# Patient Record
Sex: Male | Born: 1975 | Race: Black or African American | Hispanic: No | Marital: Single | Smoking: Never smoker
Health system: Southern US, Community
[De-identification: ages and names within clinical notes are randomized; demographics above are authoritative.]

## PROBLEM LIST (undated history)

## (undated) DIAGNOSIS — J45909 Unspecified asthma, uncomplicated: Secondary | ICD-10-CM

---

## 1997-09-03 ENCOUNTER — Emergency Department (HOSPITAL_COMMUNITY): Admission: EM | Admit: 1997-09-03 | Discharge: 1997-09-03 | Payer: Self-pay | Admitting: Emergency Medicine

## 1998-01-15 ENCOUNTER — Emergency Department (HOSPITAL_COMMUNITY): Admission: EM | Admit: 1998-01-15 | Discharge: 1998-01-15 | Payer: Self-pay | Admitting: Psychiatry

## 1998-11-08 ENCOUNTER — Emergency Department (HOSPITAL_COMMUNITY): Admission: EM | Admit: 1998-11-08 | Discharge: 1998-11-08 | Payer: Self-pay | Admitting: Emergency Medicine

## 2000-06-18 ENCOUNTER — Emergency Department (HOSPITAL_COMMUNITY): Admission: EM | Admit: 2000-06-18 | Discharge: 2000-06-18 | Payer: Self-pay | Admitting: Emergency Medicine

## 2000-06-20 ENCOUNTER — Emergency Department (HOSPITAL_COMMUNITY): Admission: EM | Admit: 2000-06-20 | Discharge: 2000-06-20 | Payer: Self-pay | Admitting: Emergency Medicine

## 2004-04-13 ENCOUNTER — Emergency Department (HOSPITAL_COMMUNITY): Admission: EM | Admit: 2004-04-13 | Discharge: 2004-04-13 | Payer: Self-pay | Admitting: Emergency Medicine

## 2005-02-28 ENCOUNTER — Emergency Department (HOSPITAL_COMMUNITY): Admission: EM | Admit: 2005-02-28 | Discharge: 2005-02-28 | Payer: Self-pay | Admitting: Emergency Medicine

## 2005-06-08 ENCOUNTER — Emergency Department (HOSPITAL_COMMUNITY): Admission: EM | Admit: 2005-06-08 | Discharge: 2005-06-09 | Payer: Self-pay | Admitting: Emergency Medicine

## 2008-07-23 ENCOUNTER — Emergency Department (HOSPITAL_COMMUNITY): Admission: EM | Admit: 2008-07-23 | Discharge: 2008-07-23 | Payer: Self-pay | Admitting: Emergency Medicine

## 2010-05-28 ENCOUNTER — Emergency Department (HOSPITAL_COMMUNITY): Payer: Self-pay

## 2010-05-28 ENCOUNTER — Emergency Department (HOSPITAL_COMMUNITY)
Admission: EM | Admit: 2010-05-28 | Discharge: 2010-05-28 | Disposition: A | Discharge status: Home / Self Care | Payer: Self-pay | Attending: Emergency Medicine | Admitting: Emergency Medicine

## 2010-05-28 DIAGNOSIS — IMO0002 Reserved for concepts with insufficient information to code with codable children: Secondary | ICD-10-CM | POA: Insufficient documentation

## 2010-05-28 DIAGNOSIS — M7989 Other specified soft tissue disorders: Secondary | ICD-10-CM | POA: Insufficient documentation

## 2011-11-06 ENCOUNTER — Encounter (HOSPITAL_COMMUNITY): Payer: Self-pay | Admitting: Emergency Medicine

## 2011-11-06 ENCOUNTER — Emergency Department (HOSPITAL_COMMUNITY)
Admission: EM | Admit: 2011-11-06 | Discharge: 2011-11-06 | Disposition: A | Discharge status: Home / Self Care | Payer: Self-pay | Attending: Emergency Medicine | Admitting: Emergency Medicine

## 2011-11-06 DIAGNOSIS — Y9389 Activity, other specified: Secondary | ICD-10-CM | POA: Insufficient documentation

## 2011-11-06 DIAGNOSIS — W57XXXA Bitten or stung by nonvenomous insect and other nonvenomous arthropods, initial encounter: Secondary | ICD-10-CM

## 2011-11-06 DIAGNOSIS — Y998 Other external cause status: Secondary | ICD-10-CM | POA: Insufficient documentation

## 2011-11-06 DIAGNOSIS — IMO0001 Reserved for inherently not codable concepts without codable children: Secondary | ICD-10-CM | POA: Insufficient documentation

## 2011-11-06 MED ORDER — SULFAMETHOXAZOLE-TRIMETHOPRIM 800-160 MG PO TABS: 1.0000 | ORAL_TABLET | Freq: Two times a day (BID) | ORAL | Status: DC

## 2011-11-06 MED ORDER — DIPHENHYDRAMINE HCL 25 MG PO TABS: 25.0000 mg | ORAL_TABLET | Freq: Four times a day (QID) | ORAL | Status: DC | PRN

## 2011-11-06 MED ORDER — SULFAMETHOXAZOLE-TRIMETHOPRIM 800-160 MG PO TABS: 1.0000 | ORAL_TABLET | Freq: Two times a day (BID) | ORAL | Status: AC

## 2011-11-06 MED ORDER — OXYCODONE-ACETAMINOPHEN 5-325 MG PO TABS: 2.0000 | ORAL_TABLET | ORAL | Status: DC | PRN

## 2011-11-06 MED ORDER — IBUPROFEN 800 MG PO TABS: 800.0000 mg | ORAL_TABLET | Freq: Four times a day (QID) | ORAL | Status: DC | PRN

## 2011-11-06 MED ORDER — IBUPROFEN 800 MG PO TABS
800.0000 mg | ORAL_TABLET | Freq: Once | ORAL | Status: AC
  Administered 2011-11-06: 800 mg via ORAL
  Filled 2011-11-06: qty 1

## 2011-11-06 MED ORDER — HYDROXYZINE HCL 10 MG PO TABS
10.0000 mg | ORAL_TABLET | Freq: Once | ORAL | Status: AC
  Administered 2011-11-06: 10 mg via ORAL
  Filled 2011-11-06: qty 1

## 2011-11-06 NOTE — ED Notes (Signed)
Pt reports ?insect bite to right forearm, noticed several days ago, this am began to become reddened and swollen and painful.

## 2011-11-06 NOTE — ED Provider Notes (Signed)
History     CSN: [id number]  Arrival date & time 11/06/11  0900   None     Chief Complaint  Patient presents with  . Insect Bite    (Consider location/radiation/quality/duration/timing/severity/associated sxs/prior treatment) HPI Comments: Patient presents with a 3 day history of insect bite on his right arm. 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 Today he states that pain that is throbbing, 8/9-10 at the site of the bite, without radiation or transmission. Associated symptoms include numbness around the bite, swelling, and pruritis. Denies fever or chills. Denies NVD or abdominal pain. Denies SOB, wheezing, or difficulty swallowing.  The history is provided by the patient.    History reviewed. No pertinent past medical history.  History reviewed. No pertinent past surgical history.  History reviewed. No pertinent family history.  History  Substance Use Topics  . Smoking status: Not on file  . Smokeless tobacco: Not on file  . Alcohol Use: Not on file      Review of Systems  Constitutional: Negative for fever and chills.  HENT: Negative for trouble swallowing.   Respiratory: Negative for shortness of breath and wheezing.   Gastrointestinal: Negative for nausea, vomiting, abdominal pain and diarrhea.  Skin: Positive for color change.    Allergies  Penicillins  Home Medications   Current Outpatient Rx  Name Route Sig Dispense Refill  . ALBUTEROL SULFATE HFA 108 (90 BASE) MCG/ACT IN AERS Inhalation Inhale 2 puffs into the lungs every 6 (six) hours as needed. For shortness of breath.      BP 126/67  Pulse 80  Temp 98.2 F (36.8 C) (Oral)  Resp 16  SpO2 98%  Physical Exam  Nursing note and vitals reviewed. Constitutional: He appears well-developed and well-nourished. No distress.  HENT:  Head: Normocephalic and atraumatic.  Mouth/Throat: Oropharynx is clear and moist. No oropharyngeal exudate.  Eyes: Conjunctivae normal and EOM are normal. No scleral  icterus.  Neck: Normal range of motion. Neck supple.  Cardiovascular: Normal rate, regular rhythm and normal heart sounds.   Pulmonary/Chest: Effort normal and breath sounds normal. He has no wheezes.  Abdominal: Soft. Bowel sounds are normal. There is no tenderness.  Neurological: He is alert.  Skin: Skin is warm.       ED Course  Procedures (including critical care time)  Labs Reviewed - No data to display No results found.   1. Insect bite       MDM  Patient presented with complaint of insect bite, itching, pain and swelling. Patient given ibuprofen and hydroxyzine with resolution of pain and itching. Patient discharged on a short course of bactrim, ibuprofen for pain, and benadryl for itching. Return precautions given verbally and in discharge summary. Patient has no red flags for abscess or system anaphylaxis.     Pixie Casino, PA-C 11/06/11 [phone]

## 2011-11-07 NOTE — ED Provider Notes (Signed)
Medical screening examination/treatment/procedure(s) were performed by non-physician practitioner and as supervising physician I was immediately available for consultation/collaboration.  Kamal Jurgens R. Carter Kassel, MD 11/07/11 0657 

## 2014-06-29 ENCOUNTER — Encounter (HOSPITAL_COMMUNITY): Payer: Self-pay | Admitting: Emergency Medicine

## 2014-06-29 ENCOUNTER — Emergency Department (HOSPITAL_COMMUNITY)
Admission: EM | Admit: 2014-06-29 | Discharge: 2014-06-29 | Disposition: A | Discharge status: Home / Self Care | Payer: Self-pay | Attending: Emergency Medicine | Admitting: Emergency Medicine

## 2014-06-29 DIAGNOSIS — Z202 Contact with and (suspected) exposure to infections with a predominantly sexual mode of transmission: Secondary | ICD-10-CM | POA: Insufficient documentation

## 2014-06-29 DIAGNOSIS — Z880 Allergy status to penicillin: Secondary | ICD-10-CM | POA: Insufficient documentation

## 2014-06-29 DIAGNOSIS — R21 Rash and other nonspecific skin eruption: Secondary | ICD-10-CM | POA: Insufficient documentation

## 2014-06-29 DIAGNOSIS — J069 Acute upper respiratory infection, unspecified: Secondary | ICD-10-CM | POA: Insufficient documentation

## 2014-06-29 DIAGNOSIS — Z79899 Other long term (current) drug therapy: Secondary | ICD-10-CM | POA: Insufficient documentation

## 2014-06-29 LAB — GC/CHLAMYDIA PROBE AMP (~~LOC~~) NOT AT ARMC
Chlamydia: NEGATIVE
NEISSERIA GONORRHEA: NEGATIVE

## 2014-06-29 LAB — RPR: RPR: NONREACTIVE

## 2014-06-29 MED ORDER — DOXYCYCLINE HYCLATE 100 MG PO CAPS: 100.0000 mg | ORAL_CAPSULE | Freq: Two times a day (BID) | ORAL | Status: DC

## 2014-06-29 NOTE — ED Notes (Addendum)
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 States last week he had a "rash"; no rash present today. Pt in NAD. VSS. A/O x4.

## 2014-06-29 NOTE — Discharge Instructions (Signed)
Upper Respiratory Infection, Adult °An upper respiratory infection (URI) is also sometimes known as the common cold. The upper respiratory tract includes the nose, sinuses, throat, trachea, and bronchi. Bronchi are the airways leading to the lungs. Most people improve within 1 week, but symptoms can last up to 2 weeks. A residual cough may last even longer.  °CAUSES °Many different viruses can infect the tissues lining the upper respiratory tract. The tissues become irritated and inflamed and often become very moist. Mucus production is also common. A cold is contagious. You can easily spread the virus to others by oral contact. This includes kissing, sharing a glass, coughing, or sneezing. Touching your mouth or nose and then touching a surface, which is then touched by another person, can also spread the virus. °SYMPTOMS  °Symptoms typically develop 1 to 3 days after you come in contact with a cold virus. Symptoms vary from person to person. They may include: °· Runny nose. °· Sneezing. °· Nasal congestion. °· Sinus irritation. °· Sore throat. °· Loss of voice (laryngitis). °· Cough. °· Fatigue. °· Muscle aches. °· Loss of appetite. °· Headache. °· Low-grade fever. °DIAGNOSIS  °You might diagnose your own cold based on familiar symptoms, since most people get a cold 2 to 3 times a year. Your caregiver can confirm this based on your exam. Most importantly, your caregiver can check that your symptoms are not due to another disease such as strep throat, sinusitis, pneumonia, asthma, or epiglottitis. Blood tests, throat tests, and X-rays are not necessary to diagnose a common cold, but they may sometimes be helpful in excluding other more serious diseases. Your caregiver will decide if any further tests are required. °RISKS AND COMPLICATIONS  °You may be at risk for a more severe case of the common cold if you smoke cigarettes, have chronic heart disease (such as heart failure) or lung disease (such as asthma), or if  you have a weakened immune system. The very young and very old are also at risk for more serious infections. Bacterial sinusitis, middle ear infections, and bacterial pneumonia can complicate the common cold. The common cold can worsen asthma and chronic obstructive pulmonary disease (COPD). Sometimes, these complications can require emergency medical care and may be life-threatening. °PREVENTION  °The best way to protect against getting a cold is to practice good hygiene. Avoid oral or hand contact with people with cold symptoms. Wash your hands often if contact occurs. There is no clear evidence that vitamin C, vitamin E, echinacea, or exercise reduces the chance of developing a cold. However, it is always recommended to get plenty of rest and practice good nutrition. °TREATMENT  °Treatment is directed at relieving symptoms. There is no cure. Antibiotics are not effective, because the infection is caused by a virus, not by bacteria. Treatment may include: °· Increased fluid intake. Sports drinks offer valuable electrolytes, sugars, and fluids. °· Breathing heated mist or steam (vaporizer or shower). °· Eating chicken soup or other clear broths, and maintaining good nutrition. °· Getting plenty of rest. °· Using gargles or lozenges for comfort. °· Controlling fevers with ibuprofen or acetaminophen as directed by your caregiver. °· Increasing usage of your inhaler if you have asthma. °Zinc gel and zinc lozenges, taken in the first 24 hours of the common cold, can shorten the duration and lessen the severity of symptoms. Pain medicines may help with fever, muscle aches, and throat pain. A variety of non-prescription medicines are available to treat congestion and runny nose. Your caregiver   can make recommendations and may suggest nasal or lung inhalers for other symptoms.  HOME CARE INSTRUCTIONS   Only take over-the-counter or prescription medicines for pain, discomfort, or fever as directed by your  caregiver.  Use a warm mist humidifier or inhale steam from a shower to increase air moisture. This may keep secretions moist and make it easier to breathe.  Drink enough water and fluids to keep your urine clear or pale yellow.  Rest as needed.  Return to work when your temperature has returned to normal or as your caregiver advises. You may need to stay home longer to avoid infecting others. You can also use a face mask and careful hand washing to prevent spread of the virus. SEEK MEDICAL CARE IF:   After the first few days, you feel you are getting worse rather than better.  You need your caregiver's advice about medicines to control symptoms.  You develop chills, worsening shortness of breath, or brown or red sputum. These may be signs of pneumonia.  You develop yellow or brown nasal discharge or pain in the face, especially when you bend forward. These may be signs of sinusitis.  You develop a fever, swollen neck glands, pain with swallowing, or white areas in the back of your throat. These may be signs of strep throat. SEEK IMMEDIATE MEDICAL CARE IF:   You have a fever.  You develop severe or persistent headache, ear pain, sinus pain, or chest pain.  You develop wheezing, a prolonged cough, cough up blood, or have a change in your usual mucus (if you have chronic lung disease).  You develop sore muscles or a stiff neck. Document Released: 07/15/2000 Document Revised: 04/13/2011 Document Reviewed: 04/26/2013 [facility] Patient Information 2015 [place], [place]. This information is not intended to replace advice given to you by your health care provider. Make sure you discuss any questions you have with your health care provider. Safe Sex Safe sex is about reducing the risk of giving or getting a sexually transmitted disease (STD). STDs are spread through sexual contact involving the genitals, mouth, or rectum. Some STDs can be cured and others cannot. Safe sex can also prevent  unintended pregnancies.  WHAT ARE SOME SAFE SEX PRACTICES?  Limit your sexual activity to only one partner who is having sex with only you.  Talk to your partner about his or her past partners, past STDs, and drug use.  Use a condom every time you have sexual intercourse. This includes vaginal, oral, and anal sexual activity. Both females and males should wear condoms during oral sex. Only use latex or polyurethane condoms and water-based lubricants. Using petroleum-based lubricants or oils to lubricate a condom will weaken the condom and increase the chance that it will break. The condom should be in place from the beginning to the end of sexual activity. Wearing a condom reduces, but does not completely eliminate, your risk of getting or giving an STD. STDs can be spread by contact with infected body fluids and skin.  Get vaccinated for hepatitis B and HPV.  Avoid alcohol and recreational drugs, which can affect your judgment. You may forget to use a condom or participate in high-risk sex.  For females, avoid douching after sexual intercourse. Douching can spread an infection farther into the reproductive tract.  Check your body for signs of sores, blisters, rashes, or unusual discharge. See your health care provider if you notice any of these signs.  Avoid sexual contact if you have symptoms of an infection  or are being treated for an STD. If you or your partner has herpes, avoid sexual contact when blisters are present. Use condoms at all other times.  If you are at risk of being infected with HIV, it is recommended that you take a prescription medicine daily to prevent HIV infection. This is called pre-exposure prophylaxis (PrEP). You are considered at risk if:  You are a man who has sex with other men (MSM).  You are a heterosexual man or woman who is sexually active with more than one partner.  You take drugs by injection.  You are sexually active with a partner who has HIV.  Talk  with your health care provider about whether you are at high risk of being infected with HIV. If you choose to begin PrEP, you should first be tested for HIV. You should then be tested every 3 months for as long as you are taking PrEP.  See your health care provider for regular screenings, exams, and tests for other STDs. Before having sex with a new partner, each of you should be screened for STDs and should talk about the results with each other. WHAT ARE THE BENEFITS OF SAFE SEX?   There is less chance of getting or giving an STD.  You can prevent unwanted or unintended pregnancies.  By discussing safe sex concerns with your partner, you may increase feelings of intimacy, comfort, trust, and honesty between the two of you. Document Released: 02/27/2004 Document Revised: 06/05/2013 Document Reviewed: 07/13/2011 [facility] Patient Information 2015 [place]. This information is not intended to replace advice given to you by your health care provider. Make sure you discuss any questions you have with your health care provider.

## 2014-06-29 NOTE — ED Provider Notes (Signed)
CSN: [id number]     Arrival date & time 06/29/14  4098 History   First MD Initiated Contact with Patient 06/29/14 [phone]     Chief Complaint  Patient presents with  . URI     (Consider location/radiation/quality/duration/timing/severity/associated sxs/prior Treatment) Patient is a 39 y.o. male presenting with URI. The history is provided by the patient. No language interpreter was used.  URI Presenting symptoms: congestion   Severity:  Moderate Onset quality:  Gradual Duration:  1 week Timing:  Constant Progression:  Worsening Chronicity:  New Relieved by:  Nothing Worsened by:  Nothing tried Associated symptoms: no arthralgias   Risk factors: no recent illness and no sick contacts   Pt reports he had a rash on his hand last week.  Pt thinks he has syphilis.  Pt request treatment. Pt reports he has a cough as well  History reviewed. No pertinent past medical history. History reviewed. No pertinent past surgical history. History reviewed. No pertinent family history. History  Substance Use Topics  . Smoking status: Never Smoker   . Smokeless tobacco: Not on file  . Alcohol Use: Yes     Comment: 3-4 times a week, socially.     Review of Systems  HENT: Positive for congestion.   Musculoskeletal: Negative for arthralgias.  All other systems reviewed and are negative.     Allergies  Penicillins  Home Medications   Prior to Admission medications   Medication Sig Start Date End Date Taking? Authorizing Provider  Chlorphen-Phenyleph-ASA (ALKA-SELTZER PLUS COLD PO) Take 1 tablet by mouth every 6 (six) hours as needed (cough/cold symptoms).   Yes Historical Provider, MD  pseudoephedrine-acetaminophen (TYLENOL SINUS) 30-500 MG TABS Take 1 tablet by mouth every 4 (four) hours as needed (sinus allergies).   Yes Historical Provider, MD  albuterol (PROVENTIL HFA;VENTOLIN HFA) 108 (90 BASE) MCG/ACT inhaler Inhale 2 puffs into the lungs every 6 (six) hours as needed. For shortness  of breath.    Historical Provider, MD  diphenhydrAMINE (BENADRYL) 25 MG tablet Take 1 tablet (25 mg total) by mouth every 6 (six) hours as needed for itching (Rash). Patient not taking: Reported on 06/29/2014 11/06/11   Tia L Oliveri, PA-C  ibuprofen (ADVIL,MOTRIN) 800 MG tablet Take 1 tablet (800 mg total) by mouth every 6 (six) hours as needed for pain. 11/06/11   Tia L Oliveri, PA-C   BP 138/81 mmHg  Pulse 80  Temp(Src) 98.2 F (36.8 C) (Oral)  Resp 12  Ht  (1.676 m)  Wt 230 lb (104.327 kg)  BMI 37.14 kg/m2  SpO2 97% Physical Exam  Constitutional: He appears well-developed and well-nourished.  HENT:  Head: Normocephalic.  Eyes: Conjunctivae and EOM are normal. Pupils are equal, round, and reactive to light.  Cardiovascular: Normal rate and normal heart sounds.   Pulmonary/Chest: Effort normal and breath sounds normal.  Abdominal: Soft.  Musculoskeletal: Normal range of motion.  Neurological: He is alert.  Skin: Skin is warm.  Psychiatric: He has a normal mood and affect.  Nursing note and vitals reviewed.   ED Course  Procedures (including critical care time) Labs Review Labs Reviewed  RPR  HIV ANTIBODY (ROUTINE TESTING)  GC/CHLAMYDIA PROBE AMP ([place]) NOT AT [facility]    Imaging Review No results found.   EKG Interpretation None      MDM   Final diagnoses:  Rash  URI (upper respiratory infection)  STD exposure    Doxycycline x 14 days Pt advised to follow up at health dept  Lonia SkinnerLeslie K BuffaloSofia, PA-C 06/29/14 [phone]  Blake DivineJohn Wofford, MD 07/03/14 [phone]

## 2014-07-01 LAB — HIV ANTIBODY (ROUTINE TESTING W REFLEX): HIV SCREEN 4TH GENERATION: NONREACTIVE

## 2015-05-31 ENCOUNTER — Encounter (HOSPITAL_COMMUNITY): Payer: Self-pay | Admitting: Emergency Medicine

## 2015-05-31 DIAGNOSIS — Z880 Allergy status to penicillin: Secondary | ICD-10-CM | POA: Insufficient documentation

## 2015-05-31 DIAGNOSIS — Y998 Other external cause status: Secondary | ICD-10-CM | POA: Insufficient documentation

## 2015-05-31 DIAGNOSIS — Z79899 Other long term (current) drug therapy: Secondary | ICD-10-CM | POA: Insufficient documentation

## 2015-05-31 DIAGNOSIS — J45909 Unspecified asthma, uncomplicated: Secondary | ICD-10-CM | POA: Insufficient documentation

## 2015-05-31 DIAGNOSIS — Y9389 Activity, other specified: Secondary | ICD-10-CM | POA: Insufficient documentation

## 2015-05-31 DIAGNOSIS — Z23 Encounter for immunization: Secondary | ICD-10-CM | POA: Insufficient documentation

## 2015-05-31 DIAGNOSIS — S0181XA Laceration without foreign body of other part of head, initial encounter: Secondary | ICD-10-CM | POA: Insufficient documentation

## 2015-05-31 DIAGNOSIS — Y92009 Unspecified place in unspecified non-institutional (private) residence as the place of occurrence of the external cause: Secondary | ICD-10-CM | POA: Insufficient documentation

## 2015-05-31 DIAGNOSIS — S0990XA Unspecified injury of head, initial encounter: Secondary | ICD-10-CM | POA: Insufficient documentation
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
## 2015-06-01 ENCOUNTER — Emergency Department (HOSPITAL_COMMUNITY): Payer: Self-pay

## 2015-06-01 ENCOUNTER — Emergency Department (HOSPITAL_COMMUNITY)
Admission: EM | Admit: 2015-06-01 | Discharge: 2015-06-01 | Disposition: A | Discharge status: Home / Self Care | Payer: Self-pay | Attending: Emergency Medicine | Admitting: Emergency Medicine

## 2015-06-01 DIAGNOSIS — S0181XA Laceration without foreign body of other part of head, initial encounter: Secondary | ICD-10-CM

## 2015-06-01 DIAGNOSIS — S0990XA Unspecified injury of head, initial encounter: Secondary | ICD-10-CM

## 2015-06-01 HISTORY — DX: Unspecified asthma, uncomplicated: J45.909

## 2015-06-01 MED ORDER — OXYCODONE-ACETAMINOPHEN 5-325 MG PO TABS
2.0000 | ORAL_TABLET | Freq: Once | ORAL | Status: AC
  Administered 2015-06-01: 2 via ORAL
  Filled 2015-06-01: qty 2

## 2015-06-01 MED ORDER — TETANUS-DIPHTH-ACELL PERTUSSIS 5-2.5-18.5 LF-MCG/0.5 IM SUSP
0.5000 mL | Freq: Once | INTRAMUSCULAR | Status: AC
  Administered 2015-06-01: 0.5 mL via INTRAMUSCULAR
  Filled 2015-06-01: qty 0.5

## 2015-06-01 MED ORDER — TRAMADOL HCL 50 MG PO TABS: 50.0000 mg | ORAL_TABLET | Freq: Four times a day (QID) | ORAL | Status: AC | PRN

## 2015-06-01 MED ORDER — LIDOCAINE-EPINEPHRINE (PF) 2 %-1:200000 IJ SOLN
10.0000 mL | Freq: Once | INTRAMUSCULAR | Status: AC
  Administered 2015-06-01: 10 mL
  Filled 2015-06-01: qty 20
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
## 2015-06-01 NOTE — Discharge Instructions (Signed)
Facial Laceration ° A facial laceration is a cut on the face. These injuries can be painful and cause bleeding. Lacerations usually heal quickly, but they need special care to reduce scarring. °DIAGNOSIS  °Your health care provider will take a medical history, ask for details about how the injury occurred, and examine the wound to determine how deep the cut is. °TREATMENT  °Some facial lacerations may not require closure. Others may not be able to be closed because of an increased risk of infection. The risk of infection and the chance for successful closure will depend on various factors, including the amount of time since the injury occurred. °The wound may be cleaned to help prevent infection. If closure is appropriate, pain medicines may be given if needed. Your health care provider will use stitches (sutures), wound glue (adhesive), or skin adhesive strips to repair the laceration. These tools bring the skin edges together to allow for faster healing and a better cosmetic outcome. If needed, you may also be given a tetanus shot. °HOME CARE INSTRUCTIONS °· Only take over-the-counter or prescription medicines as directed by your health care provider. °· Follow your health care provider's instructions for wound care. These instructions will vary depending on the technique used for closing the wound. °For Sutures: °· Keep the wound clean and dry.   °· If you were given a bandage (dressing), you should change it at least once a day. Also change the dressing if it becomes wet or dirty, or as directed by your health care provider.   °· Wash the wound with soap and water 2 times a day. Rinse the wound off with water to remove all soap. Pat the wound dry with a clean towel.   °· After cleaning, apply a thin layer of the antibiotic ointment recommended by your health care provider. This will help prevent infection and keep the dressing from sticking.   °· You may shower as usual after the first 24 hours. Do not soak the  wound in water until the sutures are removed.   °· Get your sutures removed as directed by your health care provider. With facial lacerations, sutures should usually be taken out after 4-5 days to avoid stitch marks.   °· Wait a few days after your sutures are removed before applying any makeup. °For Skin Adhesive Strips: °· Keep the wound clean and dry.   °· Do not get the skin adhesive strips wet. You may bathe carefully, using caution to keep the wound dry.   °· If the wound gets wet, pat it dry with a clean towel.   °· Skin adhesive strips will fall off on their own. You may trim the strips as the wound heals. Do not remove skin adhesive strips that are still stuck to the wound. They will fall off in time.   °For Wound Adhesive: °· You may briefly wet your wound in the shower or bath. Do not soak or scrub the wound. Do not swim. Avoid periods of heavy sweating until the skin adhesive has fallen off on its own. After showering or bathing, gently pat the wound dry with a clean towel.   °· Do not apply liquid medicine, cream medicine, ointment medicine, or makeup to your wound while the skin adhesive is in place. This may loosen the film before your wound is healed.   °· If a dressing is placed over the wound, be careful not to apply tape directly over the skin adhesive. This may cause the adhesive to be pulled off before the wound is healed.   °· Avoid   prolonged exposure to sunlight or tanning lamps while the skin adhesive is in place. °· The skin adhesive will usually remain in place for 5-10 days, then naturally fall off the skin. Do not pick at the adhesive film.   °After Healing: °Once the wound has healed, cover the wound with sunscreen during the day for 1 full year. This can help minimize scarring. Exposure to ultraviolet light in the first year will darken the scar. It can take 1-2 years for the scar to lose its redness and to heal completely.  °SEEK MEDICAL CARE IF: °· You have a fever. °SEEK IMMEDIATE  MEDICAL CARE IF: °· You have redness, pain, or swelling around the wound.   °· You see a yellowish-white fluid (pus) coming from the wound.   °  °This information is not intended to replace advice given to you by your health care provider. Make sure you discuss any questions you have with your health care provider. °  °Document Released: 02/27/2004 Document Revised: 02/09/2014 Document Reviewed: 09/01/2012 °Elsevier Interactive Patient Education ©2016 [employer] ° °

## 2015-06-22 ENCOUNTER — Emergency Department (HOSPITAL_COMMUNITY)
Admission: EM | Admit: 2015-06-22 | Discharge: 2015-06-22 | Disposition: A | Discharge status: Home / Self Care | Payer: Self-pay | Attending: Emergency Medicine | Admitting: Emergency Medicine

## 2015-06-22 DIAGNOSIS — J45909 Unspecified asthma, uncomplicated: Secondary | ICD-10-CM | POA: Insufficient documentation

## 2015-06-22 DIAGNOSIS — Z4802 Encounter for removal of sutures: Secondary | ICD-10-CM | POA: Insufficient documentation
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
## 2015-06-22 NOTE — Discharge Instructions (Signed)
May wish to use cocoa butter or mederma to help reduce scarring.  Suture Removal, Care After Refer to this sheet in the next few weeks. These instructions provide you with information on caring for yourself after your procedure. Your health care provider may also give you more specific instructions. Your treatment has been planned according to current medical practices, but problems sometimes occur. Call your health care provider if you have any problems or questions after your procedure. WHAT TO EXPECT AFTER THE PROCEDURE After your stitches (sutures) are removed, it is typical to have the following:  Some discomfort and swelling in the wound area.  Slight redness in the area. HOME CARE INSTRUCTIONS   If you have skin adhesive strips over the wound area, do not take the strips off. They will fall off on their own in a few days. If the strips remain in place after 14 days, you may remove them.  Change any bandages (dressings) at least once a day or as directed by your health care provider. If the bandage sticks, soak it off with warm, soapy water.  Apply cream or ointment only as directed by your health care provider. If using cream or ointment, wash the area with soap and water 2 times a day to remove all the cream or ointment. Rinse off the soap and pat the area dry with a clean towel.  Keep the wound area dry and clean. If the bandage becomes wet or dirty, or if it develops a bad smell, change it as soon as possible.  Continue to protect the wound from injury.  Use sunscreen when out in the sun. New scars become sunburned easily. SEEK MEDICAL CARE IF:  You have increasing redness, swelling, or pain in the wound.  You see pus coming from the wound.  You have a fever.  You notice a bad smell coming from the wound or dressing.  Your wound breaks open (edges not staying together).   This information is not intended to replace advice given to you by your health care provider. Make  sure you discuss any questions you have with your health care provider.   Document Released: 10/14/2000 Document Revised: 11/09/2012 Document Reviewed: 08/31/2012 Elsevier Interactive Patient Education [employer]2016 Elsevier Inc[employer]

## 2015-06-22 NOTE — ED Notes (Signed)
Declined W/C at D/C and was escorted to lobby by RN. 

## 2015-06-22 NOTE — ED Notes (Signed)
Pt presents for removal of sutures

## 2017-06-21 ENCOUNTER — Observation Stay (HOSPITAL_COMMUNITY)
Admission: EM | Admit: 2017-06-21 | Discharge: 2017-06-22 | Disposition: A | Discharge status: Home / Self Care | Payer: Self-pay | Attending: Urology | Admitting: Urology

## 2017-06-21 ENCOUNTER — Emergency Department (HOSPITAL_COMMUNITY): Payer: Self-pay | Admitting: Anesthesiology

## 2017-06-21 ENCOUNTER — Emergency Department (HOSPITAL_COMMUNITY): Payer: Self-pay

## 2017-06-21 ENCOUNTER — Encounter (HOSPITAL_COMMUNITY): Payer: Self-pay

## 2017-06-21 ENCOUNTER — Encounter (HOSPITAL_COMMUNITY)
Admission: EM | Disposition: A | Discharge status: Home / Self Care | Payer: Self-pay | Source: Home / Self Care | Attending: Emergency Medicine

## 2017-06-21 DIAGNOSIS — Y9389 Activity, other specified: Secondary | ICD-10-CM | POA: Insufficient documentation

## 2017-06-21 DIAGNOSIS — S3994XA Unspecified injury of external genitals, initial encounter: Secondary | ICD-10-CM | POA: Diagnosis present

## 2017-06-21 DIAGNOSIS — W378XXA Explosion and rupture of other pressurized tire, pipe or hose, initial encounter: Secondary | ICD-10-CM | POA: Insufficient documentation

## 2017-06-21 DIAGNOSIS — Y990 Civilian activity done for income or pay: Secondary | ICD-10-CM | POA: Insufficient documentation

## 2017-06-21 DIAGNOSIS — Z880 Allergy status to penicillin: Secondary | ICD-10-CM | POA: Insufficient documentation

## 2017-06-21 DIAGNOSIS — J45909 Unspecified asthma, uncomplicated: Secondary | ICD-10-CM | POA: Insufficient documentation

## 2017-06-21 DIAGNOSIS — Y9289 Other specified places as the place of occurrence of the external cause: Secondary | ICD-10-CM | POA: Insufficient documentation

## 2017-06-21 DIAGNOSIS — Z7982 Long term (current) use of aspirin: Secondary | ICD-10-CM | POA: Insufficient documentation

## 2017-06-21 DIAGNOSIS — S39848A Other specified injuries of external genitals, initial encounter: Principal | ICD-10-CM | POA: Insufficient documentation

## 2017-06-21 DIAGNOSIS — S3130XA Unspecified open wound of scrotum and testes, initial encounter: Secondary | ICD-10-CM

## 2017-06-21 DIAGNOSIS — Z79899 Other long term (current) drug therapy: Secondary | ICD-10-CM | POA: Insufficient documentation

## 2017-06-21 DIAGNOSIS — N433 Hydrocele, unspecified: Secondary | ICD-10-CM | POA: Insufficient documentation

## 2017-06-21 HISTORY — PX: SCROTAL EXPLORATION: SHX2386

## 2017-06-21 LAB — I-STAT CHEM 8, ED
BUN: 11 mg/dL (ref 6–20)
CHLORIDE: 103 mmol/L (ref 101–111)
CREATININE: 1.1 mg/dL (ref 0.61–1.24)
Calcium, Ion: 1.13 mmol/L — ABNORMAL LOW (ref 1.15–1.40)
Glucose, Bld: 109 mg/dL — ABNORMAL HIGH (ref 65–99)
HEMATOCRIT: 46 % (ref 39.0–52.0)
HEMOGLOBIN: 15.6 g/dL (ref 13.0–17.0)
POTASSIUM: 3.7 mmol/L (ref 3.5–5.1)
Sodium: 142 mmol/L (ref 135–145)
TCO2: 26 mmol/L (ref 22–32)

## 2017-06-21 LAB — CBC WITH DIFFERENTIAL/PLATELET
Abs Immature Granulocytes: 0 10*3/uL (ref 0.0–0.1)
BASOS ABS: 0 10*3/uL (ref 0.0–0.1)
BASOS PCT: 0 %
EOS ABS: 0.2 10*3/uL (ref 0.0–0.7)
EOS PCT: 2 %
HCT: 46.2 % (ref 39.0–52.0)
Hemoglobin: 15.3 g/dL (ref 13.0–17.0)
Immature Granulocytes: 0 %
Lymphocytes Relative: 19 %
Lymphs Abs: 1.7 10*3/uL (ref 0.7–4.0)
MCH: 30.1 pg (ref 26.0–34.0)
MCHC: 33.1 g/dL (ref 30.0–36.0)
MCV: 90.8 fL (ref 78.0–100.0)
MONOS PCT: 8 %
Monocytes Absolute: 0.8 10*3/uL (ref 0.1–1.0)
NEUTROS ABS: 6.3 10*3/uL (ref 1.7–7.7)
NEUTROS PCT: 71 %
PLATELETS: 187 10*3/uL (ref 150–400)
RBC: 5.09 MIL/uL (ref 4.22–5.81)
RDW: 13.3 % (ref 11.5–15.5)
WBC: 9 10*3/uL (ref 4.0–10.5)

## 2017-06-21 LAB — URINALYSIS, ROUTINE W REFLEX MICROSCOPIC
Bilirubin Urine: NEGATIVE
GLUCOSE, UA: NEGATIVE mg/dL
Ketones, ur: NEGATIVE mg/dL
Nitrite: NEGATIVE
PH: 5 (ref 5.0–8.0)
PROTEIN: NEGATIVE mg/dL
Specific Gravity, Urine: 1.023 (ref 1.005–1.030)

## 2017-06-21 SURGERY — EXPLORATION, SCROTUM
Anesthesia: General | Site: Scrotum | Laterality: Left

## 2017-06-21 MED ORDER — 0.9 % SODIUM CHLORIDE (POUR BTL) OPTIME
TOPICAL | Status: DC | PRN
  Administered 2017-06-21: 1000 mL

## 2017-06-21 MED ORDER — ONDANSETRON HCL 4 MG/2ML IJ SOLN
4.0000 mg | Freq: Once | INTRAMUSCULAR | Status: AC
  Administered 2017-06-21: 4 mg via INTRAVENOUS
  Filled 2017-06-21: qty 2

## 2017-06-21 MED ORDER — MORPHINE SULFATE (PF) 4 MG/ML IV SOLN
4.0000 mg | Freq: Once | INTRAVENOUS | Status: AC
  Administered 2017-06-21: 4 mg via INTRAVENOUS
  Filled 2017-06-21: qty 1

## 2017-06-21 MED ORDER — OXYCODONE-ACETAMINOPHEN 5-325 MG PO TABS: 1.0000 | ORAL_TABLET | ORAL | 0 refills | Status: DC | PRN

## 2017-06-21 MED ORDER — DIPHENHYDRAMINE HCL 12.5 MG/5ML PO ELIX: 12.5000 mg | ORAL_SOLUTION | Freq: Four times a day (QID) | ORAL | Status: DC | PRN

## 2017-06-21 MED ORDER — OXYCODONE HCL 5 MG PO TABS: 5.0000 mg | ORAL_TABLET | ORAL | Status: DC | PRN

## 2017-06-21 MED ORDER — MIDAZOLAM HCL 2 MG/2ML IJ SOLN
INTRAMUSCULAR | Status: AC
  Filled 2017-06-21: qty 2

## 2017-06-21 MED ORDER — OXYCODONE-ACETAMINOPHEN 5-325 MG PO TABS
1.0000 | ORAL_TABLET | Freq: Once | ORAL | Status: AC
  Administered 2017-06-21: 1 via ORAL
  Filled 2017-06-21: qty 1

## 2017-06-21 MED ORDER — FENTANYL CITRATE (PF) 100 MCG/2ML IJ SOLN
INTRAMUSCULAR | Status: AC
  Filled 2017-06-21: qty 2

## 2017-06-21 MED ORDER — SUCCINYLCHOLINE CHLORIDE 20 MG/ML IJ SOLN
INTRAMUSCULAR | Status: DC | PRN
  Administered 2017-06-21: 140 mg via INTRAVENOUS

## 2017-06-21 MED ORDER — HYDROMORPHONE HCL 2 MG/ML IJ SOLN: 0.5000 mg | INTRAMUSCULAR | Status: DC | PRN

## 2017-06-21 MED ORDER — BUPIVACAINE HCL (PF) 0.5 % IJ SOLN
INTRAMUSCULAR | Status: AC
  Filled 2017-06-21: qty 30

## 2017-06-21 MED ORDER — OXYCODONE HCL 5 MG PO TABS: 5.0000 mg | ORAL_TABLET | Freq: Once | ORAL | Status: DC | PRN

## 2017-06-21 MED ORDER — CLINDAMYCIN PHOSPHATE 900 MG/50ML IV SOLN
INTRAVENOUS | Status: AC
  Filled 2017-06-21: qty 50

## 2017-06-21 MED ORDER — ONDANSETRON HCL 4 MG/2ML IJ SOLN
INTRAMUSCULAR | Status: AC
  Filled 2017-06-21: qty 6

## 2017-06-21 MED ORDER — LIDOCAINE HCL (PF) 0.5 % IJ SOLN
INTRAMUSCULAR | Status: AC
  Filled 2017-06-21: qty 50

## 2017-06-21 MED ORDER — CLINDAMYCIN PHOSPHATE 900 MG/50ML IV SOLN
INTRAVENOUS | Status: DC | PRN
  Administered 2017-06-21: 900 mg via INTRAVENOUS

## 2017-06-21 MED ORDER — LIDOCAINE HCL (CARDIAC) PF 100 MG/5ML IV SOSY
PREFILLED_SYRINGE | INTRAVENOUS | Status: DC | PRN
  Administered 2017-06-21: 40 mg via INTRAVENOUS

## 2017-06-21 MED ORDER — BUPIVACAINE HCL (PF) 0.5 % IJ SOLN
INTRAMUSCULAR | Status: DC | PRN
  Administered 2017-06-21: 10 mL

## 2017-06-21 MED ORDER — OXYCODONE HCL 5 MG/5ML PO SOLN: 5.0000 mg | Freq: Once | ORAL | Status: DC | PRN

## 2017-06-21 MED ORDER — PROPOFOL 10 MG/ML IV BOLUS
INTRAVENOUS | Status: AC
Start: 2017-06-21 — End: ?
  Filled 2017-06-21: qty 20

## 2017-06-21 MED ORDER — FENTANYL CITRATE (PF) 100 MCG/2ML IJ SOLN
INTRAMUSCULAR | Status: DC | PRN
  Administered 2017-06-21: 50 ug via INTRAVENOUS
  Administered 2017-06-21 (×2): 100 ug via INTRAVENOUS

## 2017-06-21 MED ORDER — ONDANSETRON HCL 4 MG/2ML IJ SOLN: 4.0000 mg | INTRAMUSCULAR | Status: DC | PRN

## 2017-06-21 MED ORDER — OXYCODONE-ACETAMINOPHEN 5-325 MG PO TABS: 1.0000 | ORAL_TABLET | ORAL | 0 refills | Status: AC | PRN

## 2017-06-21 MED ORDER — PROPOFOL 10 MG/ML IV BOLUS
INTRAVENOUS | Status: DC | PRN
  Administered 2017-06-21: 200 mg via INTRAVENOUS

## 2017-06-21 MED ORDER — DIPHENHYDRAMINE HCL 50 MG/ML IJ SOLN: 12.5000 mg | Freq: Four times a day (QID) | INTRAMUSCULAR | Status: DC | PRN

## 2017-06-21 MED ORDER — DEXAMETHASONE SODIUM PHOSPHATE 10 MG/ML IJ SOLN
INTRAMUSCULAR | Status: DC | PRN
  Administered 2017-06-21: 10 mg via INTRAVENOUS

## 2017-06-21 MED ORDER — ONDANSETRON HCL 4 MG/2ML IJ SOLN: 4.0000 mg | Freq: Once | INTRAMUSCULAR | Status: DC | PRN

## 2017-06-21 MED ORDER — MIDAZOLAM HCL 5 MG/5ML IJ SOLN
INTRAMUSCULAR | Status: DC | PRN
  Administered 2017-06-21: 2 mg via INTRAVENOUS

## 2017-06-21 MED ORDER — LACTATED RINGERS IV SOLN
INTRAVENOUS | Status: DC | PRN
  Administered 2017-06-21: 22:00:00 via INTRAVENOUS

## 2017-06-21 MED ORDER — ONDANSETRON HCL 4 MG/2ML IJ SOLN
INTRAMUSCULAR | Status: DC | PRN
  Administered 2017-06-21: 4 mg via INTRAVENOUS

## 2017-06-21 MED ORDER — FENTANYL CITRATE (PF) 250 MCG/5ML IJ SOLN
INTRAMUSCULAR | Status: AC
  Filled 2017-06-21: qty 5

## 2017-06-21 MED ORDER — FENTANYL CITRATE (PF) 100 MCG/2ML IJ SOLN
25.0000 ug | INTRAMUSCULAR | Status: DC | PRN
  Administered 2017-06-21: 50 ug via INTRAVENOUS

## 2017-06-21 SURGICAL SUPPLY — 26 items
ADH SKN CLS APL DERMABOND .7 (GAUZE/BANDAGES/DRESSINGS) ×1
BNDG GAUZE ELAST 4 BULKY (GAUZE/BANDAGES/DRESSINGS) ×2 IMPLANT
COVER SURGICAL LIGHT HANDLE (MISCELLANEOUS) ×2 IMPLANT
DECANTER SPIKE VIAL GLASS SM (MISCELLANEOUS) ×2 IMPLANT
DERMABOND ADVANCED (GAUZE/BANDAGES/DRESSINGS) ×2
DERMABOND ADVANCED .7 DNX12 (GAUZE/BANDAGES/DRESSINGS) IMPLANT
DRAIN PENROSE 1/4X12 LTX STRL (WOUND CARE) ×2 IMPLANT
DRAPE LAPAROTOMY TRNSV 102X78 (DRAPE) ×4 IMPLANT
DRAPE SHEET LG 3/4 BI-LAMINATE (DRAPES) ×2 IMPLANT
ELECT REM PT RETURN 9FT ADLT (ELECTROSURGICAL) ×3
ELECTRODE REM PT RTRN 9FT ADLT (ELECTROSURGICAL) IMPLANT
GAUZE SPONGE 4X4 12PLY STRL (GAUZE/BANDAGES/DRESSINGS) ×2 IMPLANT
GAUZE SPONGE 4X4 16PLY XRAY LF (GAUZE/BANDAGES/DRESSINGS) ×2 IMPLANT
GLOVE BIOGEL PI IND STRL 7.0 (GLOVE) IMPLANT
GLOVE BIOGEL PI INDICATOR 7.0 (GLOVE) ×2
GLOVE EUDERMIC 7 POWDERFREE (GLOVE) ×4 IMPLANT
GLOVE SURG SS PI 6.5 STRL IVOR (GLOVE) ×2 IMPLANT
GOWN STRL REUS W/TWL LRG LVL3 (GOWN DISPOSABLE) ×2 IMPLANT
KIT BASIN OR (CUSTOM PROCEDURE TRAY) ×2 IMPLANT
NDL HYPO 25X1 1.5 SAFETY (NEEDLE) IMPLANT
NEEDLE HYPO 25X1 1.5 SAFETY (NEEDLE) ×3 IMPLANT
PACK GENERAL/GYN (CUSTOM PROCEDURE TRAY) ×2 IMPLANT
SOL PREP POV-IOD 4OZ 10% (MISCELLANEOUS) ×2 IMPLANT
SUT CHROMIC 3 0 SH 27 (SUTURE) ×4 IMPLANT
SUT MON AB 4-0 PC3 18 (SUTURE) ×2 IMPLANT
SYR CONTROL 10ML LL (SYRINGE) ×2 IMPLANT
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
## 2017-06-21 NOTE — Transfer of Care (Signed)
Immediate Anesthesia Transfer of Care Note  Patient: Walter Conley  Procedure(s) Performed: SCROTUM EXPLORATION; LEFT TESTICULAR REPAIR (Left Scrotum)  Patient Location: PACU  Anesthesia Type:General  Level of Consciousness: awake, alert  and oriented  Airway & Oxygen Therapy: Patient Spontanous Breathing and Patient connected to nasal cannula oxygen  Post-op Assessment: Report given to RN and Post -op Vital signs reviewed and stable  Post vital signs: Reviewed and stable  Last Vitals:  Vitals Value Taken Time  BP    Temp    Pulse    Resp    SpO2      Last Pain:  Vitals:   06/21/17 1900  TempSrc:   PainSc: 10-Worst pain ever         Complications: No apparent anesthesia complications
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
## 2017-06-21 NOTE — Discharge Summary (Signed)
Physician Discharge Summary  Patient ID: DAIN LASETER MRN: [id number] DOB/AGE: Jun 22, 1975 42 y.o.  Admit date: 06/21/2017 Discharge date: 06/21/2017  Admission Diagnoses:  Discharge Diagnoses:  Active Problems:   Trauma of scrotum   Discharged Condition: good  Hospital Course: Patient was admitted to the hospital with left testicular rupture.  He underwent scrotal exploration and repair of the left testicle.  He tolerated the procedure well and was stable postoperatively for discharge.  Consults: None  Significant Diagnostic Studies: Scrotal ultrasound revealed left testicular rupture  Treatments: surgery: As above  Discharge Exam: Blood pressure (!) (P) 165/82, pulse (P) 81, temperature (P) 97.7 F (36.5 C), resp. rate (P) 19, SpO2 (P) 95 %. General appearance: alert no acute distress Scrotum with Penrose drain secured to scrotal fluffs.  Normal postoperative change.  Dressing in place.  Disposition: Discharge disposition: 01-Home or Self Care        Allergies as of 06/21/2017      Reactions   Penicillins Other (See Comments)   Unknown childhood allergic reaction Has patient had a PCN reaction causing immediate rash, facial/tongue/throat swelling, SOB or lightheadedness with hypotension: Unknown Has patient had a PCN reaction causing severe rash involving mucus membranes or skin necrosis: Unknown Has patient had a PCN reaction that required hospitalization: Unknown Has patient had a PCN reaction occurring within the last 10 years: No If all of the above answers are "NO", then may proceed with Cephalosporin use.      Medication List    TAKE these medications   albuterol 108 (90 Base) MCG/ACT inhaler Commonly known as:  PROVENTIL HFA;VENTOLIN HFA Inhale 2 puffs into the lungs every 6 (six) hours as needed for wheezing or shortness of breath. For shortness of breath.   aspirin-acetaminophen-caffeine 250-250-65 MG tablet Commonly known as:  EXCEDRIN  MIGRAINE Take 2 tablets by mouth daily as needed for headache.   oxyCODONE-acetaminophen 5-325 MG tablet Commonly known as:  PERCOCET Take 1 tablet by mouth every 4 (four) hours as needed for severe pain.   traMADol 50 MG tablet Commonly known as:  ULTRAM Take 1 tablet (50 mg total) by mouth every 6 (six) hours as needed.        Signed: Ray Church, III 06/21/2017, 11:45 PM
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
## 2017-06-21 NOTE — Anesthesia Preprocedure Evaluation (Addendum)
Anesthesia Evaluation  Patient identified by MRN, date of birth, ID band Patient awake    Reviewed: Allergy & Precautions, NPO status , Patient's Chart, lab work & pertinent test results  Airway Mallampati: III  TM Distance: >3 FB Neck ROM: Full    Dental  (+) Teeth Intact, Dental Advisory Given   Pulmonary    breath sounds clear to auscultation       Cardiovascular  Rhythm:Regular Rate:Normal     Neuro/Psych    GI/Hepatic   Endo/Other    Renal/GU      Musculoskeletal   Abdominal   Peds  Hematology   Anesthesia Other Findings   Reproductive/Obstetrics                            Anesthesia Physical Anesthesia Plan  ASA: II and emergent  Anesthesia Plan: General   Post-op Pain Management:    Induction: Intravenous, Rapid sequence and Cricoid pressure planned  PONV Risk Score and Plan: Ondansetron and Dexamethasone  Airway Management Planned: Oral ETT  Additional Equipment:   Intra-op Plan:   Post-operative Plan: Extubation in OR  Informed Consent: I have reviewed the patients History and Physical, chart, labs and discussed the procedure including the risks, benefits and alternatives for the proposed anesthesia with the patient or authorized representative who has indicated his/her understanding and acceptance.   Dental advisory given  Plan Discussed with: CRNA and Anesthesiologist  Anesthesia Plan Comments:        Anesthesia Quick Evaluation

## 2017-06-21 NOTE — ED Provider Notes (Signed)
Medical screening examination/treatment/procedure(s) were conducted as a shared visit with non-physician practitioner(s) and myself.  I personally evaluated the patient during the encounter. Briefly, the patient is a 42 yo M here with left scrotal trauma. On exam, pt has large, swollen, tender L testicle with associated exquisite TTP. Testes appear descended though large hematoma limits exam on left. No penile trauma. No perineal hematoma or bruising. No other significant trauma. U/S shows testicular rupture. Urology consult.   EKG Interpretation None          Shaune Pollack, MD 06/21/17 [phone]
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
## 2017-06-21 NOTE — Anesthesia Procedure Notes (Signed)
Procedure Name: Intubation Date/Time: 06/21/2017 10:42 PM Performed by: Babs Bertin, CRNA Pre-anesthesia Checklist: Patient identified, Emergency Drugs available, Suction available and Patient being monitored Patient Re-evaluated:Patient Re-evaluated prior to induction Oxygen Delivery Method: Circle System Utilized Preoxygenation: Pre-oxygenation with 100% oxygen Induction Type: IV induction, Rapid sequence and Cricoid Pressure applied Laryngoscope Size: Mac and 3 Grade View: Grade II Tube type: Oral Tube size: 7.5 mm Number of attempts: 1 Airway Equipment and Method: Stylet and Oral airway Placement Confirmation: ETT inserted through vocal cords under direct vision,  positive ETCO2 and breath sounds checked- equal and bilateral Secured at: 22 cm Tube secured with: Tape Dental Injury: Teeth and Oropharynx as per pre-operative assessment

## 2017-06-21 NOTE — Discharge Instructions (Addendum)
Mechanical Wound Debridement Mechanical wound debridement is a treatment to remove dead tissue from a wound. This helps the wound heal. The treatment involves cleaning the wound (irrigation) and using a pad or gauze (dressing) to remove dead tissue and debris from the wound. There are different types of mechanical wound debridement. Depending on the wound, you may need to repeat this procedure or change to another form of debridement as your wound starts to heal. Tell a health care provider about:  Any allergies you have.  All medicines you are taking, including vitamins, herbs, eye drops, creams, and over-the-counter medicines.  Any blood disorders you have.  Any medical conditions you have, including any conditions that: ? Cause a significant decrease in blood circulation to the part of the body where the wound is, such as peripheral vascular disease. ? Compromise your defense (immune) system or white blood count.  Any surgeries you have had.  Whether you are pregnant or may be pregnant. What are the risks? Generally, this is a safe procedure. However, problems may occur, including:  Infection.  Bleeding.  Damage to healthy tissue in and around your wound.  Soreness or pain.  Failure of the wound to heal.  Scarring.  What happens before the procedure? You may be given antibiotic medicine to help prevent infection. What happens during the procedure?  Your health care provider may apply a numbing medicine (topical anesthetic) to the wound.  Your health care provider will irrigate your wound with a germ-free (sterile), salt-water (saline) solution. This removes debris, bacteria, and dead tissue.  Depending on what type of mechanical wound debridement you are having, your health care provider may do one of the following: ? Put a dressing on your wound. You may have dry gauze pad placed into the wound. Your health care provider will remove the gauze after the wound is dry.  Any dead tissue and debris that has dried into the gauze will be lifted out of the wound (wet-to-dry debridement). ? Use a type of pad (monofilament fiber debridement pad). This pad has a fluffy surface on one side that picks up dead tissue and debris from your wound. Your health care provider wets the pad and wipes it over your wound for several minutes. ? Irrigate your wound with a pressurized stream of solution such as saline or water.  Once your health care provider is finished, he or she may apply a light dressing to your wound. The procedure may vary among health care providers and hospitals. What happens after the procedure?  You may receive medicine for pain.  You will continue to receive antibiotic medicine if it was started before your procedure. This information is not intended to replace advice given to you by your health care provider. Make sure you discuss any questions you have with your health care provider. Document Released: 10/10/2014 Document Revised: 06/27/2015 Document Reviewed: 05/30/2014 Elsevier Interactive Patient Education  [employer]. There is a drain coming out of the scrotum that should come out when the dressing is removed.  You may have some drainage from this incision.  Just apply dressings over this as needed.  Discharge instructions following scrotal surgery  Call your doctor for:  Fever is greater than 100.5  Severe nausea or vomiting  Increasing pain not controlled by pain medication  Increasing redness or drainage from incisions  The number for questions or concerns is [phone]  Activity level: No lifting greater than 20 pounds (about equal to milk) for the next 2  weeks or until cleared to do so at follow-up appointment.  Otherwise activity as tolerated by comfort level.  Diet: May resume your regular diet as tolerated  Driving: No driving while still taking opiate pain medications (weight at least 6-8 hours after last dose).  No  driving if you still sore from surgery as it may limit her ability to react quickly if necessary.   Shower/bath: May shower and get incision wet pad dry immediately following.  Do not scrub vigorously for the next 2-3 weeks.  Do not soak incision (ID soaking in bath or swimming) until told he may do so by Dr., as this may promote a wound infection.  Wound care: He may cover wounds with sterile gauze as needed to prevent incisions rubbing on close follow-up in any seepage.  Where tight fitting underpants/scrotal support for at least 2 weeks.  He should apply cold compresses (ice or sac of frozen peas/corn) to your scrotum for at least 48 hours to reduce the swelling for 15 minutes at a time indirectly.  You should expect that his scrotum will swell up initially and then get smaller over the next 2-4 weeks.  Follow-up appointments: Follow-up appointment will be scheduled with Dr. Alvester Morin for a wound check.      Post Anesthesia Home Care Instructions  Activity: Get plenty of rest for the remainder of the day. A responsible individual must stay with you for 24 hours following the procedure.  For the next 24 hours, DO NOT: -Drive a car -Advertising copywriter -Drink alcoholic beverages -Take any medication unless instructed by your physician -Make any legal decisions or sign important papers.  Meals: Start with liquid foods such as gelatin or soup. Progress to regular foods as tolerated. Avoid greasy, spicy, heavy foods. If nausea and/or vomiting occur, drink only clear liquids until the nausea and/or vomiting subsides. Call your physician if vomiting continues.  Special Instructions/Symptoms: Your throat may feel dry or sore from the anesthesia or the breathing tube placed in your throat during surgery. If this causes discomfort, gargle with warm salt water. The discomfort should disappear within 24 hours.  If you had a scopolamine patch placed behind your ear for the management of post-  operative nausea and/or vomiting:  1. The medication in the patch is effective for 72 hours, after which it should be removed.  Wrap patch in a tissue and discard in the trash. Wash hands thoroughly with soap and water. 2. You may remove the patch earlier than 72 hours if you experience unpleasant side effects which may include dry mouth, dizziness or visual disturbances. 3. Avoid touching the patch. Wash your hands with soap and water after contact with the patch.

## 2017-06-22 NOTE — Progress Notes (Signed)
D;  Unable to print out AVS sheets. Took photoes and print out to give pt for DC instruction.   Needs to change pt's status.

## 2017-06-23 ENCOUNTER — Other Ambulatory Visit: Payer: Self-pay | Admitting: Urology

## 2017-06-23 ENCOUNTER — Encounter (HOSPITAL_COMMUNITY): Payer: Self-pay | Admitting: Urology

## 2017-06-23 DIAGNOSIS — N5089 Other specified disorders of the male genital organs: Secondary | ICD-10-CM

## 2017-06-23 NOTE — Anesthesia Postprocedure Evaluation (Signed)
Anesthesia Post Note  Patient: Walter Conley  Procedure(s) Performed: SCROTUM EXPLORATION; LEFT TESTICULAR REPAIR (Left Scrotum)     Patient location during evaluation: PACU Anesthesia Type: General Level of consciousness: awake and alert Pain management: pain level controlled Vital Signs Assessment: post-procedure vital signs reviewed and stable Respiratory status: spontaneous breathing, nonlabored ventilation, respiratory function stable and patient connected to nasal cannula oxygen Cardiovascular status: blood pressure returned to baseline and stable Postop Assessment: no apparent nausea or vomiting Anesthetic complications: no    Last Vitals:  Vitals:   06/22/17 0100 06/22/17 0200  BP:  (!) 160/106  Pulse:  71  Resp: 16 16  Temp:    SpO2: 93% 94%    Last Pain:  Vitals:   06/22/17 0015  TempSrc:   PainSc: 0-No pain                 Rohith Fauth COKER

## 2017-06-25 ENCOUNTER — Encounter (HOSPITAL_COMMUNITY): Payer: Self-pay | Admitting: Urology

## 2017-06-27 ENCOUNTER — Encounter (HOSPITAL_COMMUNITY): Payer: Self-pay

## 2017-06-27 ENCOUNTER — Emergency Department (HOSPITAL_COMMUNITY): Payer: Self-pay

## 2017-06-27 ENCOUNTER — Emergency Department (HOSPITAL_COMMUNITY)
Admission: EM | Admit: 2017-06-27 | Discharge: 2017-06-27 | Disposition: A | Discharge status: Home / Self Care | Payer: Self-pay | Attending: Emergency Medicine | Admitting: Emergency Medicine

## 2017-06-27 DIAGNOSIS — Z7982 Long term (current) use of aspirin: Secondary | ICD-10-CM | POA: Insufficient documentation

## 2017-06-27 DIAGNOSIS — R072 Precordial pain: Secondary | ICD-10-CM | POA: Insufficient documentation

## 2017-06-27 DIAGNOSIS — Z79899 Other long term (current) drug therapy: Secondary | ICD-10-CM | POA: Insufficient documentation

## 2017-06-27 DIAGNOSIS — J45909 Unspecified asthma, uncomplicated: Secondary | ICD-10-CM | POA: Insufficient documentation

## 2017-06-27 LAB — I-STAT TROPONIN, ED
TROPONIN I, POC: 0 ng/mL (ref 0.00–0.08)
Troponin i, poc: 0.01 ng/mL (ref 0.00–0.08)

## 2017-06-27 LAB — CBC
HEMATOCRIT: 45.6 % (ref 39.0–52.0)
Hemoglobin: 15 g/dL (ref 13.0–17.0)
MCH: 29.9 pg (ref 26.0–34.0)
MCHC: 32.9 g/dL (ref 30.0–36.0)
MCV: 90.8 fL (ref 78.0–100.0)
PLATELETS: 204 10*3/uL (ref 150–400)
RBC: 5.02 MIL/uL (ref 4.22–5.81)
RDW: 13.2 % (ref 11.5–15.5)
WBC: 10.1 10*3/uL (ref 4.0–10.5)

## 2017-06-27 LAB — BASIC METABOLIC PANEL
Anion gap: 9 (ref 5–15)
BUN: 9 mg/dL (ref 6–20)
CHLORIDE: 102 mmol/L (ref 101–111)
CO2: 28 mmol/L (ref 22–32)
CREATININE: 1.15 mg/dL (ref 0.61–1.24)
Calcium: 9 mg/dL (ref 8.9–10.3)
GFR calc Af Amer: >60 mL/min (ref >60)
GFR calc non Af Amer: >60 mL/min (ref >60)
GLUCOSE: 107 mg/dL — AB (ref 65–99)
POTASSIUM: 3.6 mmol/L (ref 3.5–5.1)
SODIUM: 139 mmol/L (ref 135–145)

## 2017-06-27 MED ORDER — HYDROCHLOROTHIAZIDE 25 MG PO TABS: 25.0000 mg | ORAL_TABLET | Freq: Every day | ORAL | 0 refills | Status: AC

## 2017-06-27 MED ORDER — IOPAMIDOL (ISOVUE-370) INJECTION 76%
INTRAVENOUS | Status: AC
  Filled 2017-06-27: qty 100

## 2017-06-27 MED ORDER — LABETALOL HCL 5 MG/ML IV SOLN
20.0000 mg | Freq: Once | INTRAVENOUS | Status: AC
  Administered 2017-06-27: 20 mg via INTRAVENOUS
  Filled 2017-06-27: qty 4

## 2017-06-27 MED ORDER — IOPAMIDOL (ISOVUE-370) INJECTION 76%
100.0000 mL | Freq: Once | INTRAVENOUS | Status: AC | PRN
  Administered 2017-06-27: 100 mL via INTRAVENOUS

## 2017-06-27 NOTE — ED Triage Notes (Signed)
Pt arrived via [facility]; per EMS pt c/o chest pain mid chest with radiation down L side; this is new onset for patient and only hx is HTN and Asthma; EMS admin  ASA; no nirto given; Pt takes lisinopril for BP but has been out for several weeks and has not gotten meds filled. 186/104; 70; 98% on RA
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
## 2017-06-27 NOTE — ED Notes (Signed)
ED Provider at bedside. 

## 2017-07-19 ENCOUNTER — Ambulatory Visit (HOSPITAL_COMMUNITY): Payer: Self-pay

## 2017-07-20 ENCOUNTER — Ambulatory Visit (HOSPITAL_COMMUNITY)
Admission: RE | Admit: 2017-07-20 | Discharge: 2017-07-20 | Disposition: A | Discharge status: Home / Self Care | Payer: Self-pay | Source: Ambulatory Visit | Attending: Urology | Admitting: Urology

## 2017-07-20 DIAGNOSIS — N433 Hydrocele, unspecified: Secondary | ICD-10-CM | POA: Insufficient documentation

## 2017-07-20 DIAGNOSIS — N5089 Other specified disorders of the male genital organs: Secondary | ICD-10-CM | POA: Insufficient documentation

## 2017-12-29 ENCOUNTER — Emergency Department (HOSPITAL_COMMUNITY): Payer: Self-pay

## 2017-12-29 ENCOUNTER — Other Ambulatory Visit: Payer: Self-pay

## 2017-12-29 ENCOUNTER — Encounter (HOSPITAL_COMMUNITY): Payer: Self-pay | Admitting: Emergency Medicine

## 2017-12-29 ENCOUNTER — Emergency Department (HOSPITAL_COMMUNITY)
Admission: EM | Admit: 2017-12-29 | Discharge: 2017-12-29 | Disposition: A | Discharge status: Home / Self Care | Payer: Self-pay | Attending: Emergency Medicine | Admitting: Emergency Medicine

## 2017-12-29 DIAGNOSIS — Z79899 Other long term (current) drug therapy: Secondary | ICD-10-CM | POA: Insufficient documentation

## 2017-12-29 DIAGNOSIS — N341 Nonspecific urethritis: Secondary | ICD-10-CM | POA: Insufficient documentation

## 2017-12-29 DIAGNOSIS — J069 Acute upper respiratory infection, unspecified: Secondary | ICD-10-CM | POA: Insufficient documentation

## 2017-12-29 DIAGNOSIS — N342 Other urethritis: Secondary | ICD-10-CM

## 2017-12-29 DIAGNOSIS — B9789 Other viral agents as the cause of diseases classified elsewhere: Secondary | ICD-10-CM

## 2017-12-29 DIAGNOSIS — J45909 Unspecified asthma, uncomplicated: Secondary | ICD-10-CM | POA: Insufficient documentation

## 2017-12-29 LAB — URINALYSIS, ROUTINE W REFLEX MICROSCOPIC
Bilirubin Urine: NEGATIVE
Glucose, UA: NEGATIVE mg/dL
Ketones, ur: NEGATIVE mg/dL
NITRITE: NEGATIVE
PH: 7 (ref 5.0–8.0)
Protein, ur: 100 mg/dL — AB
RBC / HPF: >50 RBC/hpf — ABNORMAL HIGH (ref 0–5)
SPECIFIC GRAVITY, URINE: 1.016 (ref 1.005–1.030)

## 2017-12-29 MED ORDER — ALBUTEROL SULFATE HFA 108 (90 BASE) MCG/ACT IN AERS: 1.0000 | INHALATION_SPRAY | Freq: Four times a day (QID) | RESPIRATORY_TRACT | 1 refills | Status: AC | PRN

## 2017-12-29 MED ORDER — AZITHROMYCIN 250 MG PO TABS
1000.0000 mg | ORAL_TABLET | Freq: Once | ORAL | Status: AC
  Administered 2017-12-29: 1000 mg via ORAL
  Filled 2017-12-29: qty 4

## 2017-12-29 MED ORDER — CEFTRIAXONE SODIUM 250 MG IJ SOLR
250.0000 mg | Freq: Once | INTRAMUSCULAR | Status: AC
  Administered 2017-12-29: 250 mg via INTRAMUSCULAR
  Filled 2017-12-29: qty 250

## 2017-12-29 MED ORDER — IPRATROPIUM-ALBUTEROL 0.5-2.5 (3) MG/3ML IN SOLN
3.0000 mL | Freq: Once | RESPIRATORY_TRACT | Status: AC
  Administered 2017-12-29: 3 mL via RESPIRATORY_TRACT
  Filled 2017-12-29: qty 3

## 2017-12-29 NOTE — ED Provider Notes (Signed)
[facility] EMERGENCY DEPARTMENT Provider Note   CSN: [id number] Arrival date & time: 12/29/17  4098   History   Chief Complaint Chief Complaint  Patient presents with  . Hematuria  . Dysuria  . Cough    HPI Walter Conley is a 42 y.o. male.  HPI   42 year old male presents today with several complaints.  Patient notes over the last week he has had productive cough and wheezing.  Patient has a history of asthma with breathing treatments at home with no significant improvement in symptoms.  Patient notes using over-the-counter medications, he notes Mucinex did help his symptoms slightly.  Patient notes that yesterday he developed burning and hematuria, he notes pain in the penis with urination.  He denies any purulent discharge.  He notes he is sexually active but always uses condoms.  He denies any testicular abdominal pain fever chills.  Patient notes several episodes of vomiting after coughing.   Past Medical History:  Diagnosis Date  . Asthma     Patient Active Problem List   Diagnosis Date Noted  . Trauma of scrotum 06/21/2017    Past Surgical History:  Procedure Laterality Date  . SCROTAL EXPLORATION Left 06/21/2017   Procedure: SCROTUM EXPLORATION; LEFT TESTICULAR REPAIR;  Surgeon: Crista Elliot, MD;  Location: [facility] OR;  Service: Urology;  Laterality: Left;      Home Medications    Prior to Admission medications   Medication Sig Start Date End Date Taking? Authorizing Provider  albuterol (PROVENTIL HFA;VENTOLIN HFA) 108 (90 Base) MCG/ACT inhaler Inhale 1-2 puffs into the lungs every 6 (six) hours as needed for wheezing or shortness of breath. 12/29/17   Kendle Turbin, Tinnie Gens, PA-C  aspirin-acetaminophen-caffeine (EXCEDRIN MIGRAINE) [phone] MG tablet Take 2 tablets by mouth daily as needed for headache.    [provider]  hydrochlorothiazide (HYDRODIURIL) 25 MG tablet Take 1 tablet (25 mg total) by mouth daily. 06/27/17   Azalia Bilis,  MD  oxyCODONE-acetaminophen (PERCOCET) 5-325 MG tablet Take 1 tablet by mouth every 4 (four) hours as needed for severe pain. 06/21/17 06/21/18  Crista Elliot, MD  traMADol (ULTRAM) 50 MG tablet Take 1 tablet (50 mg total) by mouth every 6 (six) hours as needed. Patient not taking: Reported on 06/21/2017 06/01/15   Gilda Crease, MD    Family History No family history on file.  Social History Social History   Tobacco Use  . Smoking status: Never Smoker  . Smokeless tobacco: Never Used  Substance Use Topics  . Alcohol use: Yes    Comment: 3-4 times a week, socially.   . Drug use: No    Allergies   Penicillins   Review of Systems Review of Systems  All other systems reviewed and are negative.  Physical Exam Updated Vital Signs BP (!) 157/107 (BP Location: Right Arm)   Pulse 94   Temp 98.9 F (37.2 C) (Oral)   Resp 17   Ht 5\' 8"  (1.727 m)   Wt 122.5 kg   SpO2 100%   BMI 41.05 kg/m   Physical Exam  Constitutional: He is oriented to person, place, and time. He appears well-developed and well-nourished.  HENT:  Head: Normocephalic and atraumatic.  Eyes: Pupils are equal, round, and reactive to light. Conjunctivae are normal. Right eye exhibits no discharge. Left eye exhibits no discharge. No scleral icterus.  Neck: Normal range of motion. No JVD present. No tracheal deviation present.  Pulmonary/Chest: Effort normal. No stridor.  Minor  bilateral expiratory wheeze, no crackles or rales no respiratory distress  Abdominal: Soft. He exhibits no distension and no mass. There is no tenderness. There is no rebound and no guarding. No hernia.  Neurological: He is alert and oriented to person, place, and time. Coordination normal.  Psychiatric: He has a normal mood and affect. His behavior is normal. Judgment and thought content normal.  Nursing note and vitals reviewed.  ED Treatments / Results  Labs (all labs ordered are listed, but only abnormal results are  displayed) Labs Reviewed  URINALYSIS, ROUTINE W REFLEX MICROSCOPIC - Abnormal; Notable for the following components:      Result Value   APPearance HAZY (*)    Hgb urine dipstick LARGE (*)    Protein, ur 100 (*)    Leukocytes, UA MODERATE (*)    RBC / HPF >50 (*)    Bacteria, UA RARE (*)    All other components within normal limits  URINE CULTURE  GC/CHLAMYDIA PROBE AMP ([place]) [facility]    EKG None  Radiology Dg Chest 2 View  Result Date: 12/29/2017 CLINICAL DATA:  Cough. EXAM: CHEST - 2 VIEW COMPARISON:  Radiographs of Jun 27, 2017. FINDINGS: The heart size and mediastinal contours are within normal limits. Both lungs are clear. The visualized skeletal structures are unremarkable. IMPRESSION: No active cardiopulmonary disease. Electronically Signed   By: Lupita RaiderJames  Green Jr, M.D.   On: 12/29/2017 10:31    Procedures Procedures (including critical care time)  Medications Ordered in ED Medications  ipratropium-albuterol (DUONEB) 0.5-2.5 (3) MG/3ML nebulizer solution 3 mL (3 mLs Nebulization Given 12/29/17 0954)  cefTRIAXone (ROCEPHIN) injection 250 mg (250 mg Intramuscular Given 12/29/17 1125)  azithromycin (ZITHROMAX) tablet 1,000 mg (1,000 mg Oral Given 12/29/17 1125)    Initial Impression / Assessment and Plan / ED Course  I have reviewed the triage vital signs and the nursing notes.  Pertinent labs & imaging results that were available during my care of the patient were reviewed by me and considered in my medical decision making (see chart for details).     Labs:   Imaging:  Consults:  Therapeutics: Ceftriaxone, azithromycin  Discharge Meds: Albuterol  Assessment/Plan: [age] YOM presents today with complaints of dysuria, question STD.  Patient treated with ceftriaxone and azithromycin.  Patient also has likely viral URI, refill of albuterol given.  Strict return precautions given, he verbalized understanding and agreement to today's plan.   Final Clinical  Impressions(s) / ED Diagnoses   Final diagnoses:  Urethritis  Viral URI with cough    ED Discharge Orders         Ordered    albuterol (PROVENTIL HFA;VENTOLIN HFA) 108 (90 Base) MCG/ACT inhaler  Every 6 hours PRN     12/29/17 1139           Eyvonne MechanicHedges, Keiandra Sullenger, PA-C 12/29/17 1146    Mancel BaleWentz, Elliott, MD 12/29/17 2034

## 2017-12-29 NOTE — Discharge Instructions (Signed)
Please read attached information. If you experience any new or worsening signs or symptoms please return to the emergency room for evaluation. Please follow-up with your primary care provider or specialist as discussed. Please use medication prescribed only as directed and discontinue taking if you have any concerning signs or symptoms.   °

## 2017-12-29 NOTE — ED Triage Notes (Signed)
Pt. Stated, Ive had pain when I pee and I had blood in my urine this morning. Or the last wee Ive had a cough.

## 2017-12-29 NOTE — ED Notes (Signed)
Patient transported to X-ray 

## 2017-12-30 LAB — URINE CULTURE: Culture: >=100000 — AB

## 2017-12-31 LAB — GC/CHLAMYDIA PROBE AMP (~~LOC~~) NOT AT ARMC
Chlamydia: NEGATIVE
Neisseria Gonorrhea: NEGATIVE

## 2020-05-08 IMAGING — CT CT ANGIO CHEST-ABD-PELV FOR DISSECTION W/ AND WO/W CM
3 of 7 series · 12 of 36 positions shown, 15 images · IV contrast (APPLIED)
Comparison: None.

CLINICAL DATA: Hypertension with chest and back pain.

EXAM:
CT ANGIOGRAPHY CHEST, ABDOMEN AND PELVIS
TECHNIQUE: Multidetector CT imaging through the chest, abdomen and pelvis was
performed using the standard protocol during bolus administration of
intravenous contrast. Multiplanar reconstructed images and MIPs were
obtained and reviewed to evaluate the vascular anatomy.
CONTRAST:  100mL DC3Q9D-GDX IOPAMIDOL (DC3Q9D-GDX) INJECTION 76%

[Series 7: arterial · axial · arterial · 0.84mm/px · z∈[+848,+1408]mm · 10 of 341 slices shown, 13 images]
[im 41/341  mediastinal]
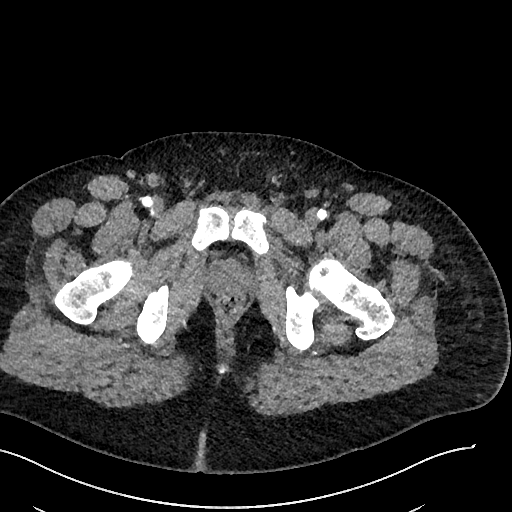
[im 41/341  bone]
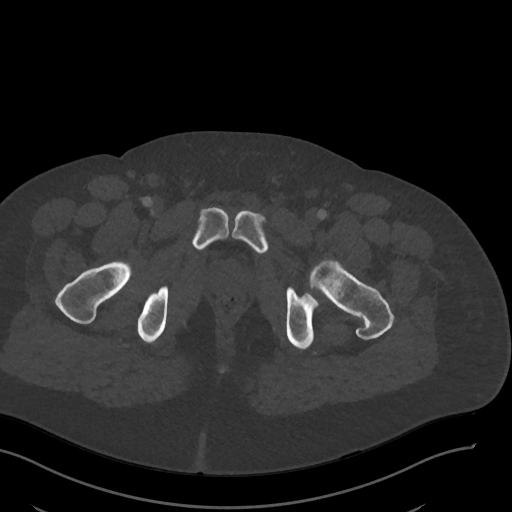
[im 81/341  mediastinal]
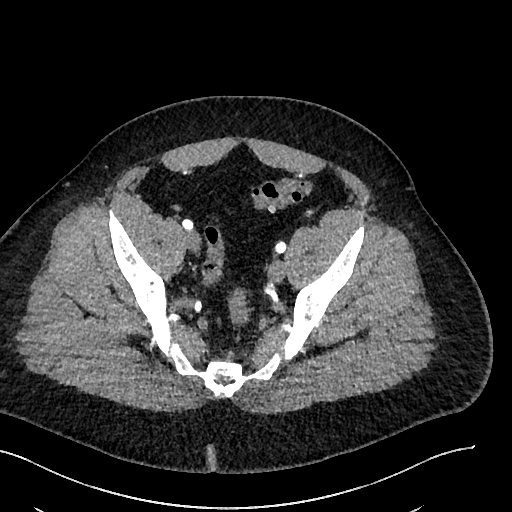
[im 121/341  mediastinal]
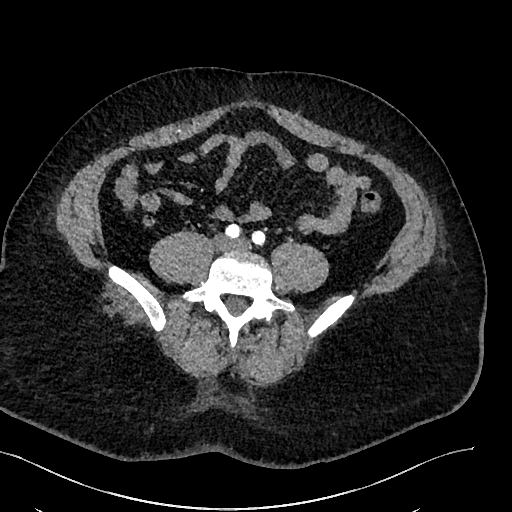
[im 161/341  mediastinal]
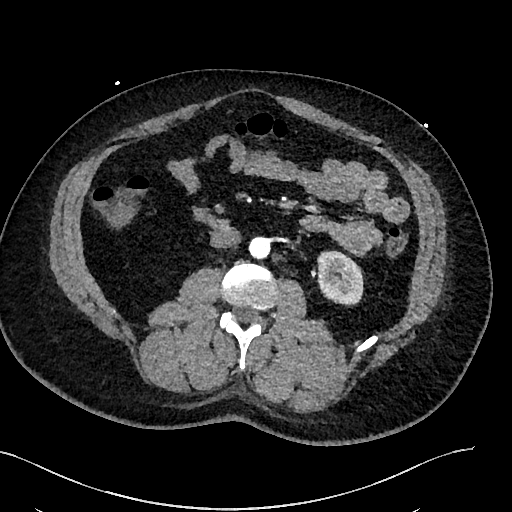
[im 201/341  mediastinal]
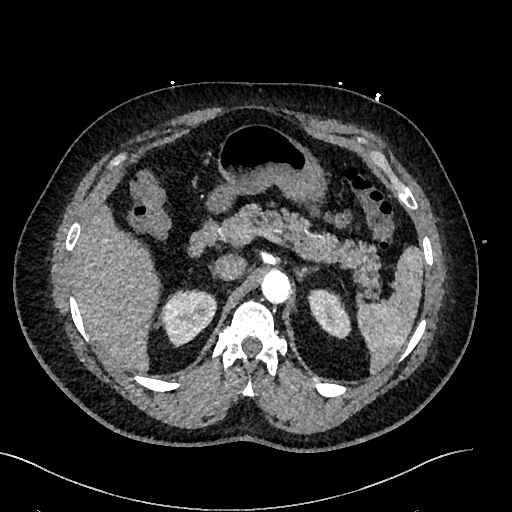
[im 241/341  mediastinal]
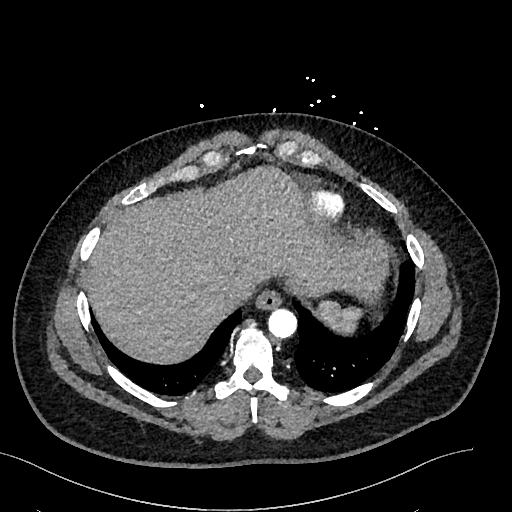
[im 261/341  lung]
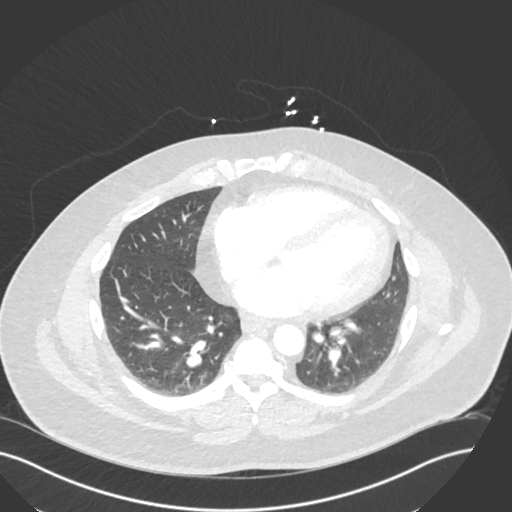
[im 281/341  mediastinal]
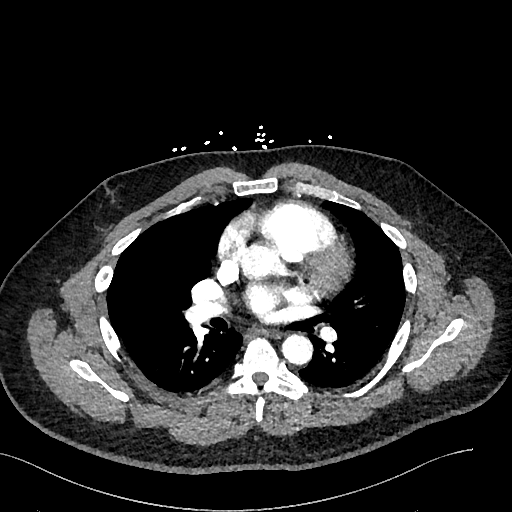
[im 281/341  lung]
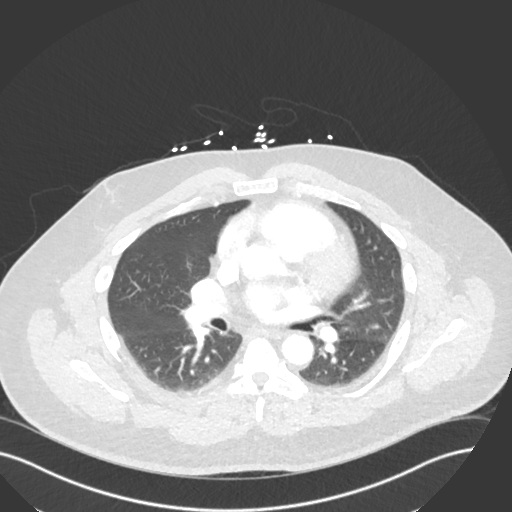
[im 301/341  lung]
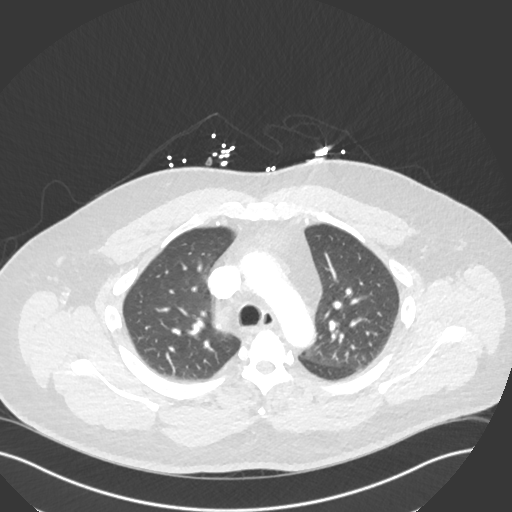
[im 321/341  mediastinal]
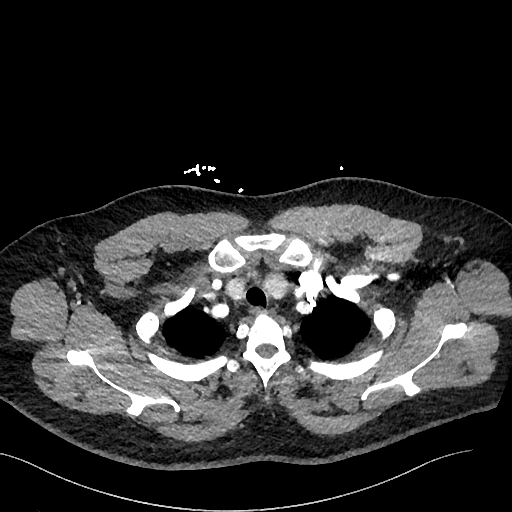
[im 321/341  lung]
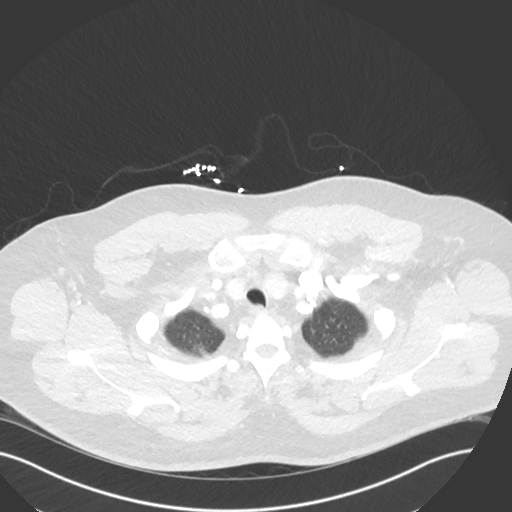

[Series 10: cor · coronal · 0.95mm/px · 1 of 147 slices shown]
[im 74/147  mediastinal]
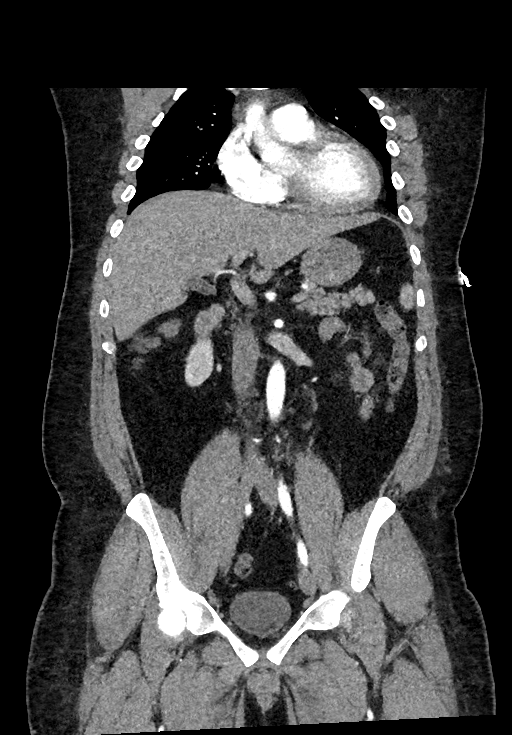

[Series 11: sag · sagittal · 0.68mm/px · 1 of 204 slices shown]
[im 3/204  mediastinal]
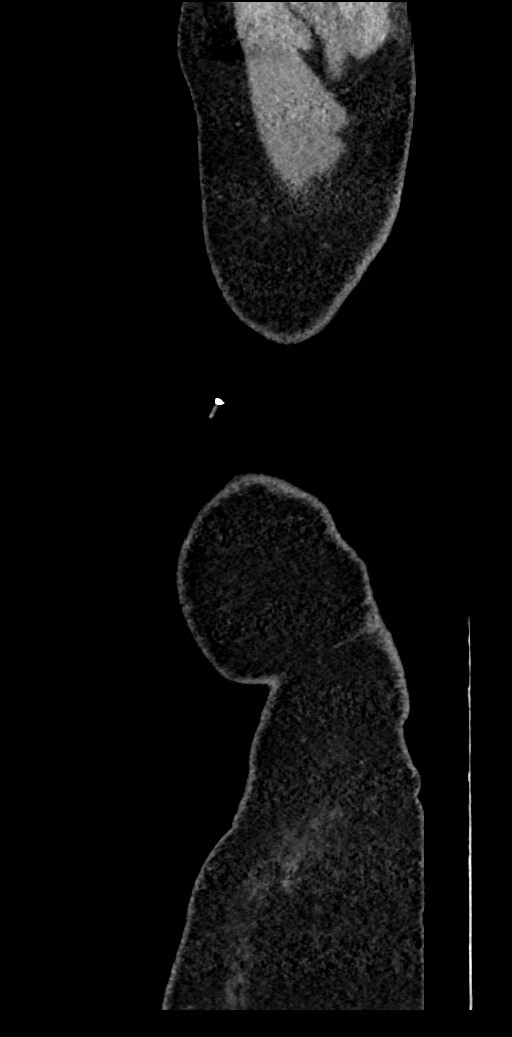

[12 of 36 positions shown; findings below may reference images not displayed]

FINDINGS: CTA CHEST FINDINGS

Cardiovascular: The heart size is normal. There is nopericardial
effusion.

The course and caliber of the thoracic aorta are normal. There is no
aortic atherosclerotic calcification. Precontrast images show no
aortic intramural hematoma. There is no blood pool, dissection or
penetrating ulcer demonstrated on arterial phase postcontrast
imaging. There is a conventional 3 vessel aortic arch branching
pattern. The proximal arch vessels are widely patent.

The central pulmonary arteries are normal.

Mediastinum/Nodes: No mediastinal, hilar or axillary
lymphadenopathy. The visualized thyroid and thoracic esophageal
course are unremarkable.

Lungs/Pleura: No pulmonary nodules or masses. No pleural effusion or
pneumothorax. No focal airspace consolidation. No focal pleural
abnormality.

Musculoskeletal: No chest wall abnormality. No acute osseous
findings.

Review of the MIP images confirms the above findings.

CTA ABDOMEN AND PELVIS FINDINGS

VASCULAR

Aorta: Normal caliber aorta without aneurysm, dissection, vasculitis
or hemodynamically significant stenosis. There is no aortic
atherosclerosis.

Celiac: No aneurysm, dissection or hemodynamically significant
stenosis. Normal branching pattern.

SMA: Widely patent without dissection or stenosis.

Renals: Single right and paired left renal arteries. No aneurysm,
dissection, stenosis or evidence of fibromuscular dysplasia.

IMA: Patent without abnormality.

Inflow: Minimal atherosclerotic calcification without stenosis or
other abnormality.

Veins: Normal course and caliber of the major veins. Assessment is
otherwise limited by the arterial dominant contrast phase.

Review of the MIP images confirms the above findings.

NON-VASCULAR

Hepatobiliary: Normal hepatic contours and density. No visible
biliary dilatation. Normal gallbladder.

Pancreas: Normal contours without ductal dilatation. No
peripancreatic fluid collection.

Spleen: Normal arterial phase splenic enhancement pattern.

Adrenals/Urinary Tract:

--Adrenal glands: Normal.

--Right kidney/ureter: No hydronephrosis or perinephric stranding.
No nephrolithiasis. No obstructing ureteral stones.

--Left kidney/ureter: No hydronephrosis or perinephric stranding. No
nephrolithiasis. No obstructing ureteral stones.

--Urinary bladder: Unremarkable.

Stomach/Bowel:

--Stomach/Duodenum: No hiatal hernia or other gastric abnormality.
Normal duodenal course and caliber.

--Small bowel: No dilatation or inflammation.

--Colon: No focal abnormality.

--Appendix: Normal.

Lymphatic:  No abdominal or pelvic lymphadenopathy.

Reproductive: No free fluid in the pelvis.

Musculoskeletal. No bony spinal canal stenosis or focal osseous
abnormality.

Other: None.

Review of the MIP images confirms the above findings.
IMPRESSION: Normal CTA of the chest, abdomen and pelvis. No acute aortic
syndrome.

## 2020-05-08 IMAGING — CR DG CHEST 2V
2 series · 2 of 2 positions shown · non-contrast
Comparison: None.

CLINICAL DATA: Mid chest pain radiating down the left side. New
onset. History of hypertension and asthma.

EXAM:
CHEST - 2 VIEW

[chest pa]
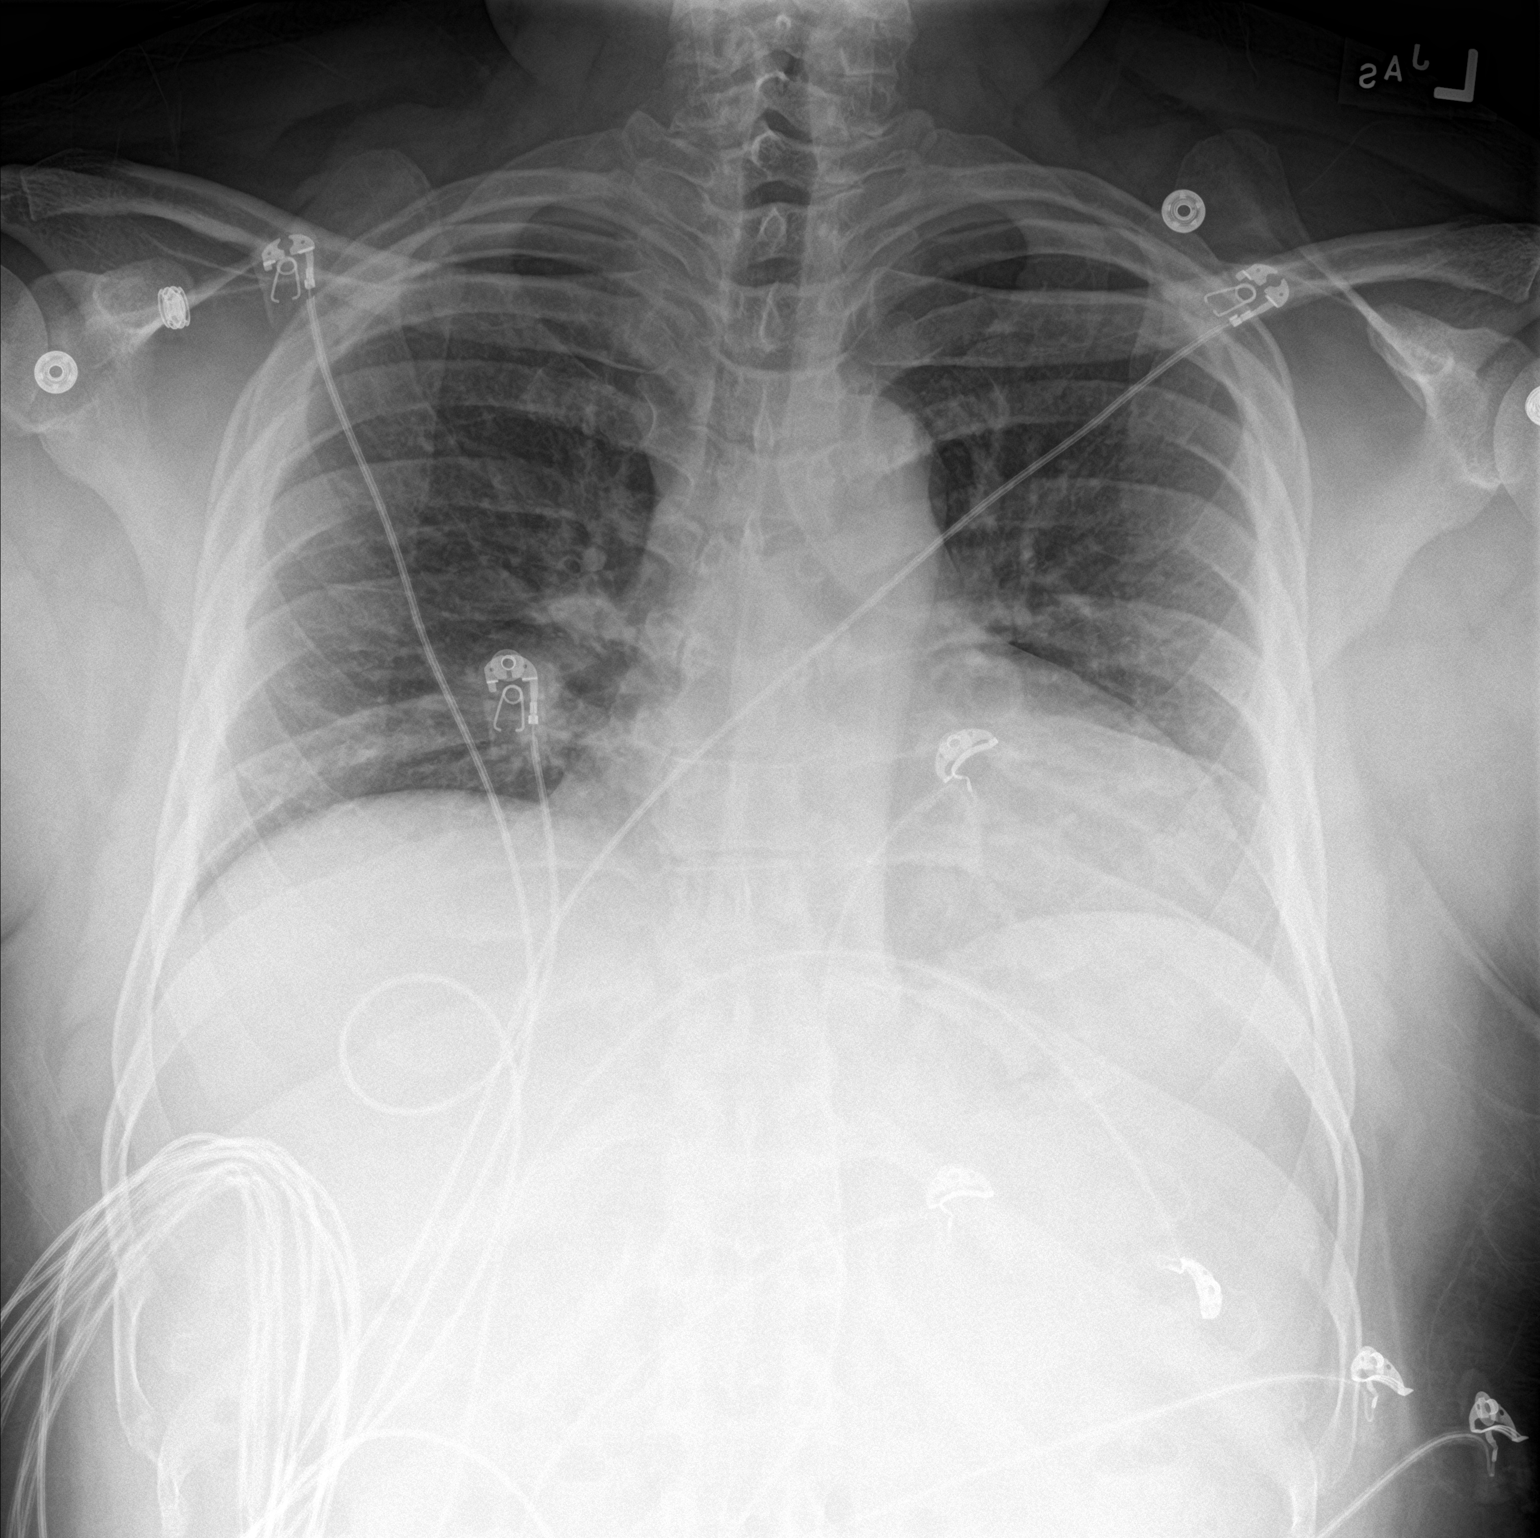

[chest lat]
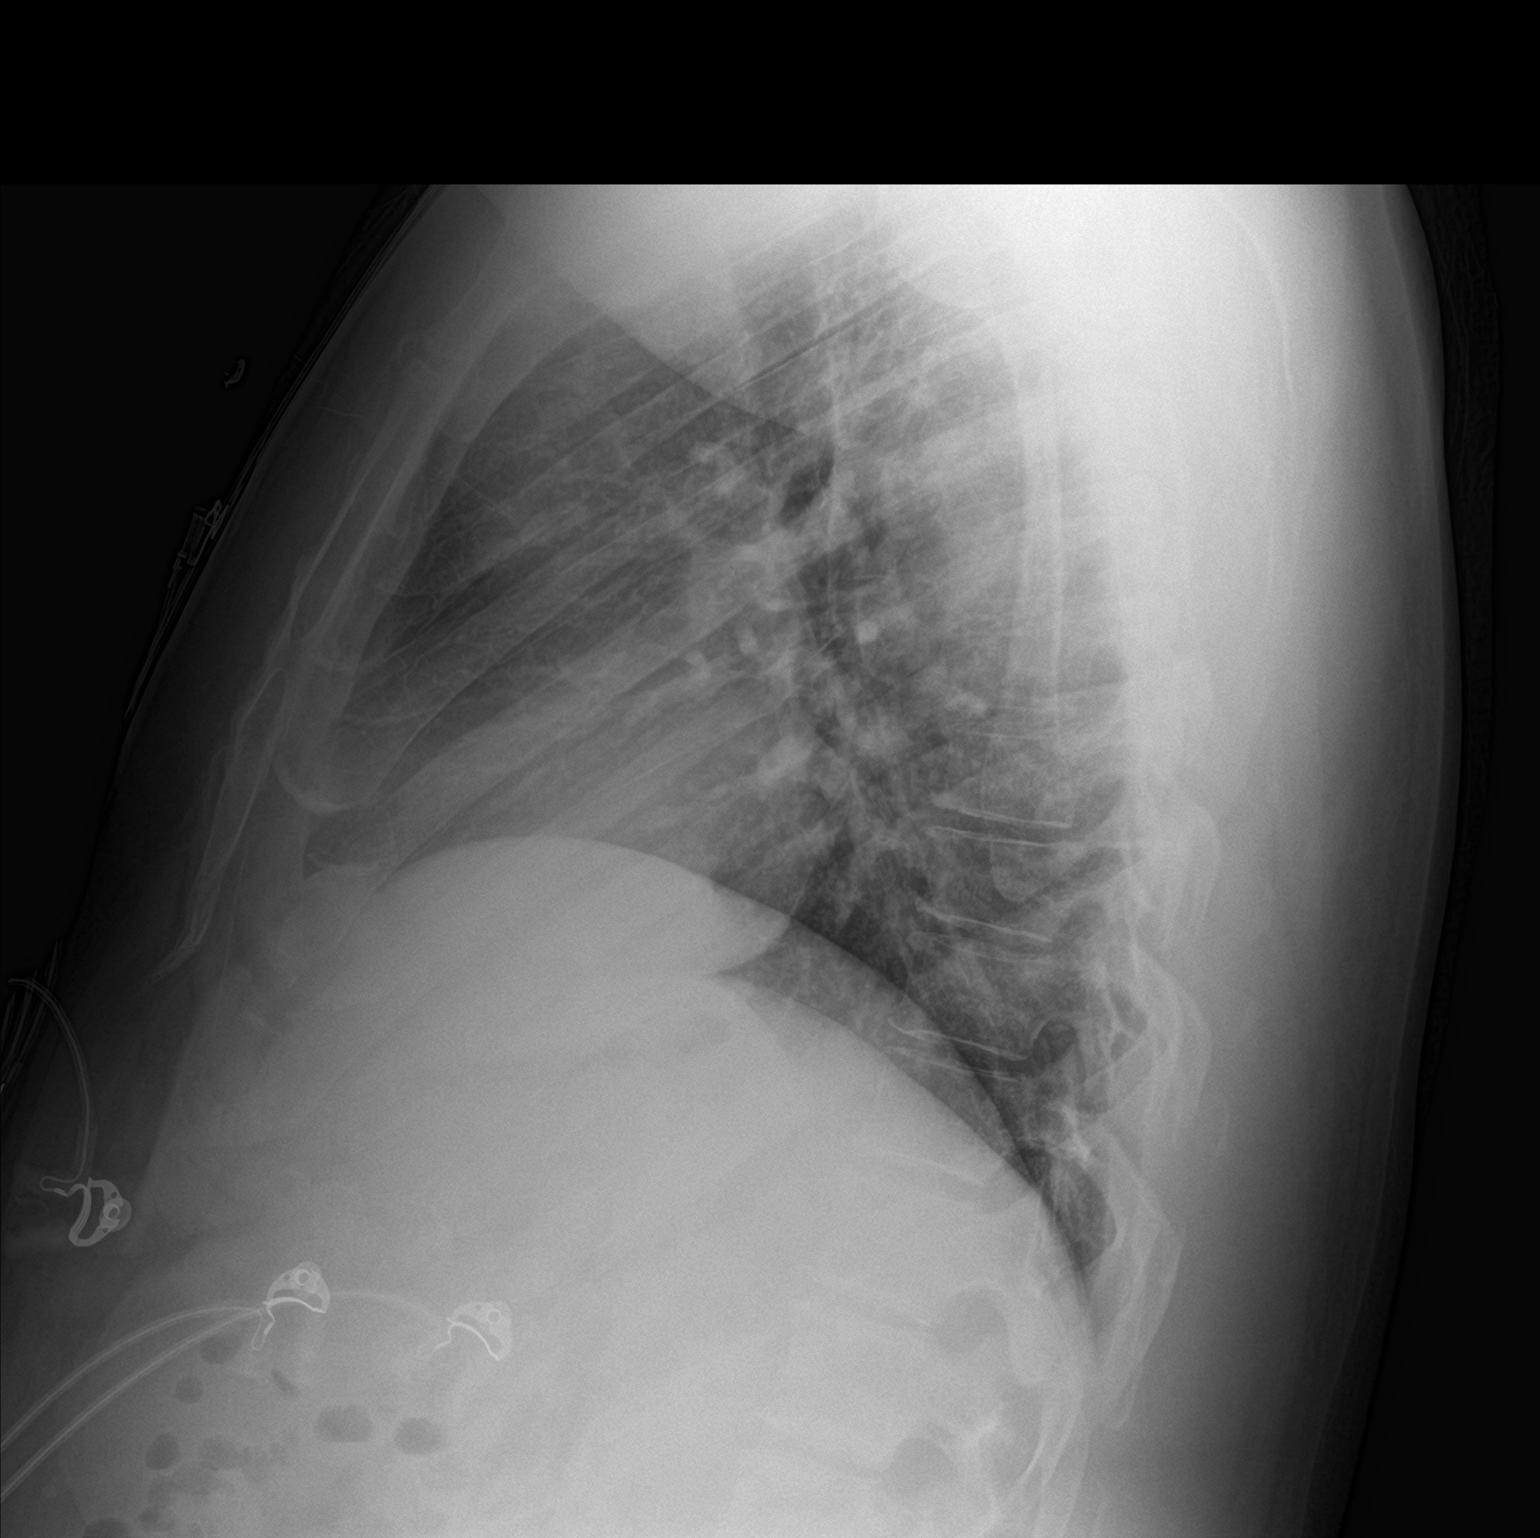

[2 of 2 positions shown; findings below may reference images not displayed]

FINDINGS: Shallow inspiration. Heart size and pulmonary vascularity are normal
for technique. No airspace disease or consolidation in the lungs. No
blunting of costophrenic angles. No pneumothorax. Mediastinal
contours appear intact.
IMPRESSION: Shallow inspiration.  No evidence of active pulmonary disease.

## 2020-05-31 IMAGING — US US SCROTUM W/ DOPPLER COMPLETE
1 series · 13 of 25 positions shown · non-contrast
Comparison: 06/21/2017

CLINICAL DATA: Follow-up to surgery 06/21/2017 after trauma to left
testicle.

EXAM:
SCROTAL ULTRASOUND
DOPPLER ULTRASOUND OF THE TESTICLES
TECHNIQUE: Complete ultrasound examination of the testicles, epididymis, and
other scrotal structures was performed. Color and spectral Doppler
ultrasound were also utilized to evaluate blood flow to the
testicles.

[Series 1: us scrotum w/ doppler complete · 13 of 80 slices shown]
[im 1/80]
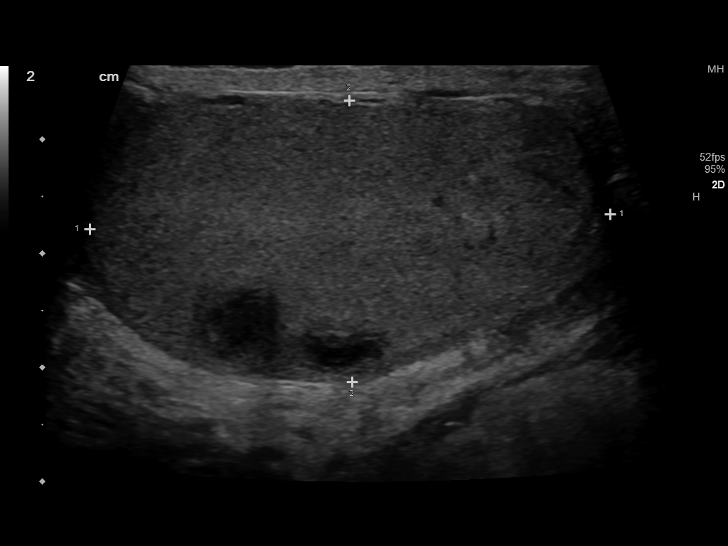
[im 7/80]
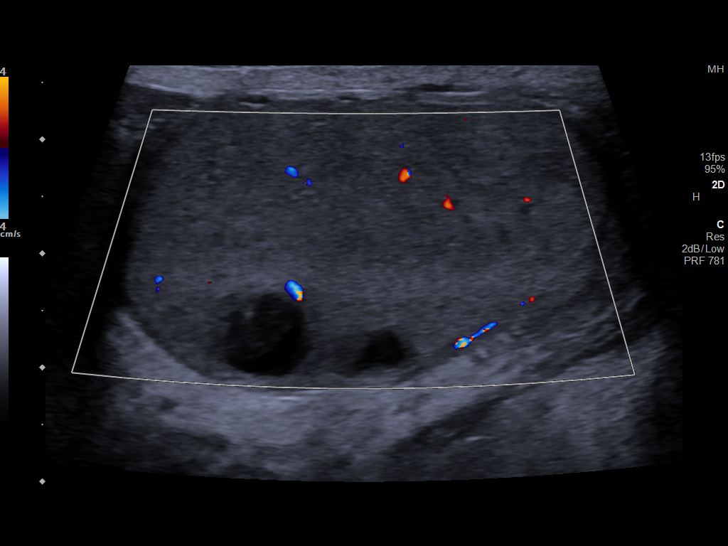
[im 14/80]
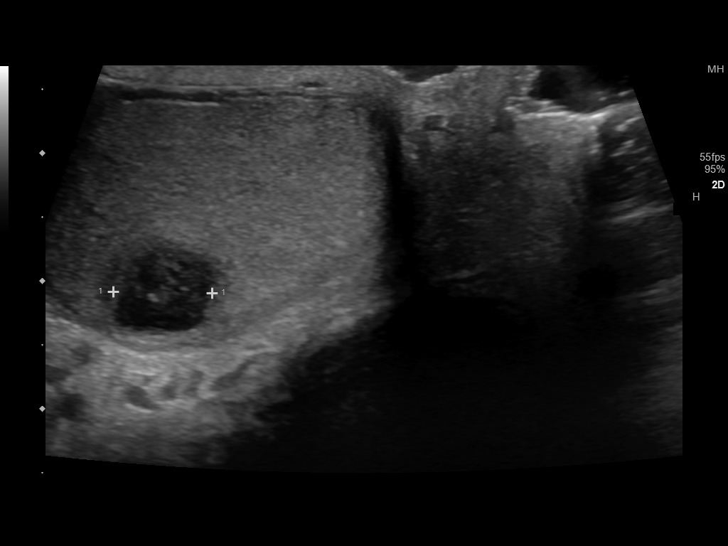
[im 20/80]
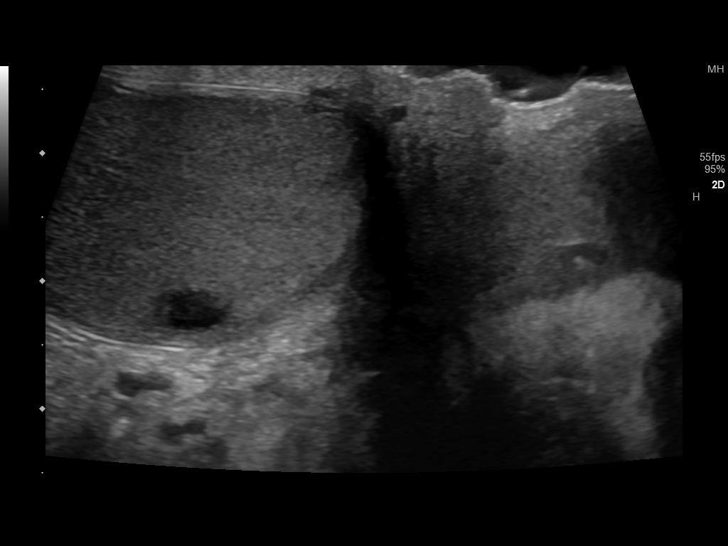
[im 27/80]
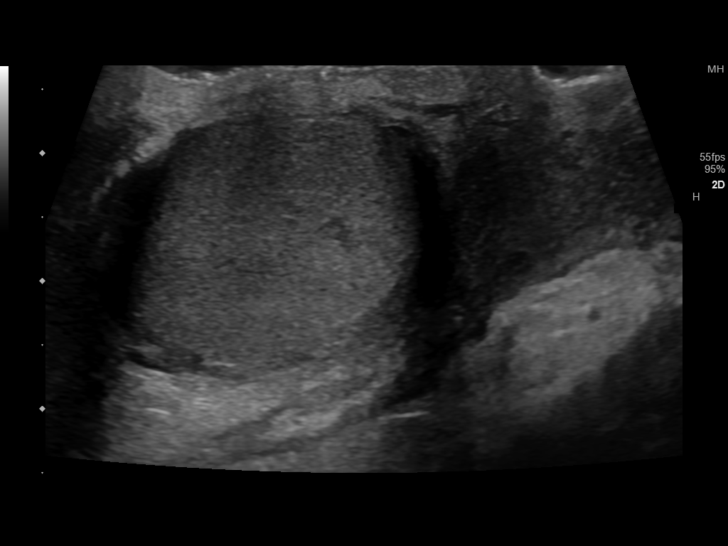
[im 33/80]
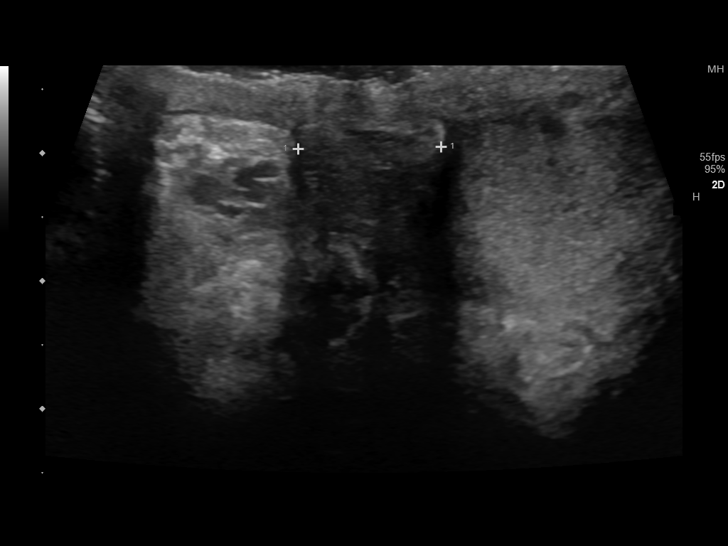
[im 40/80]
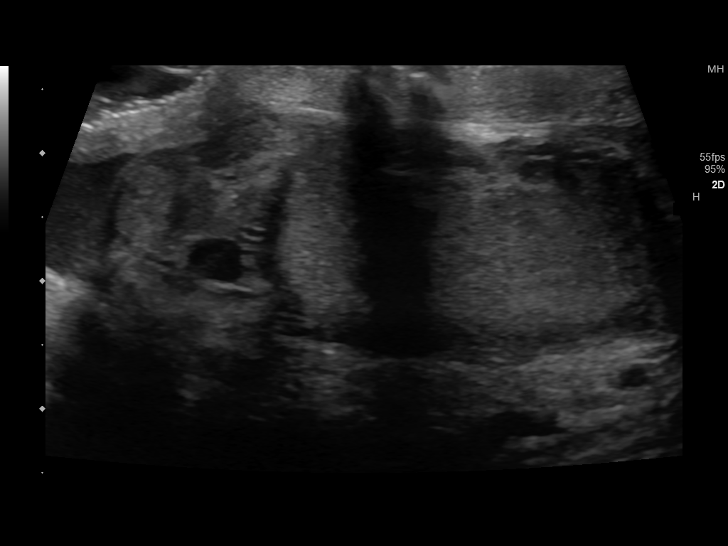
[im 47/80]
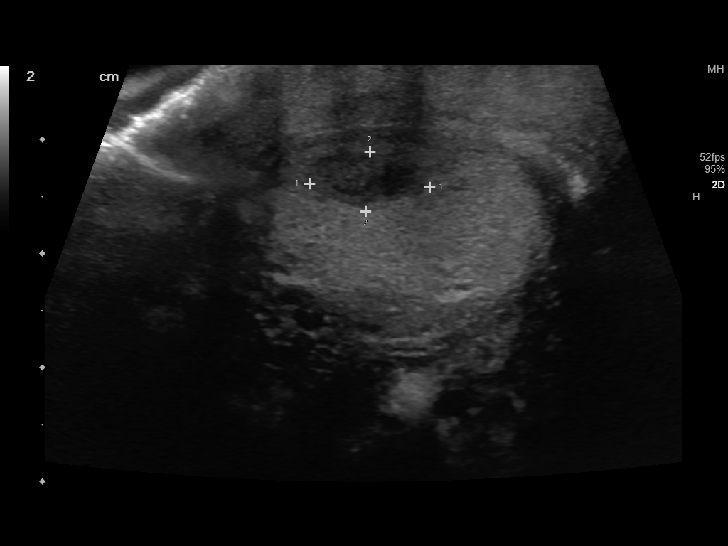
[im 53/80]
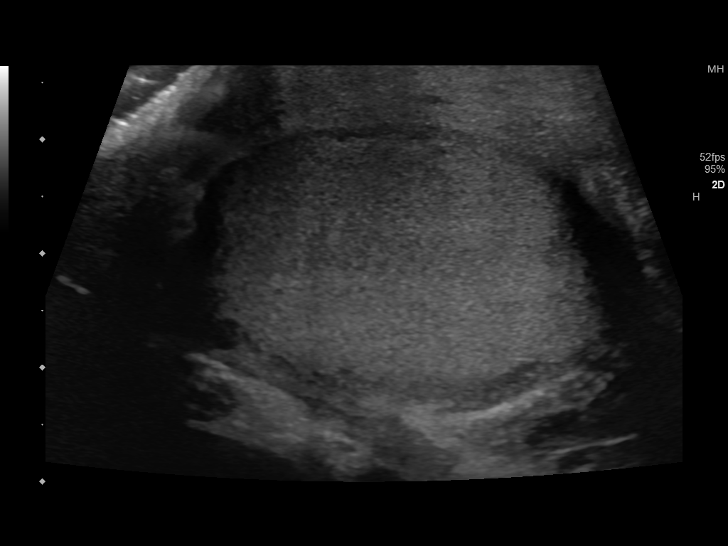
[im 60/80]
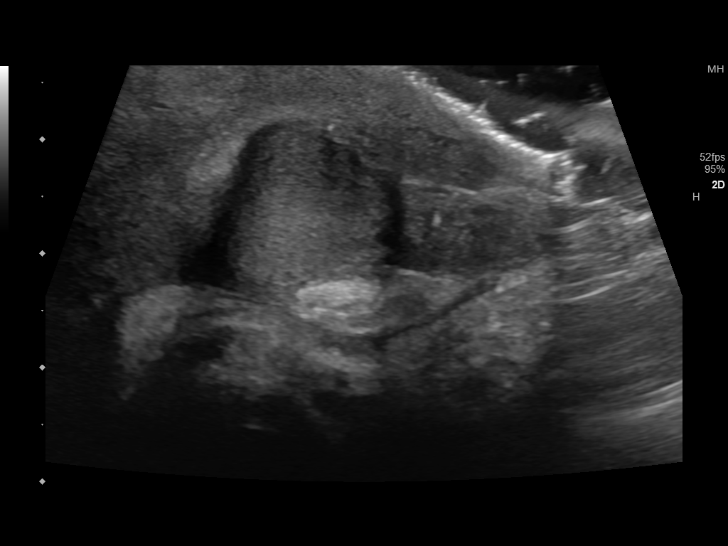
[im 66/80]
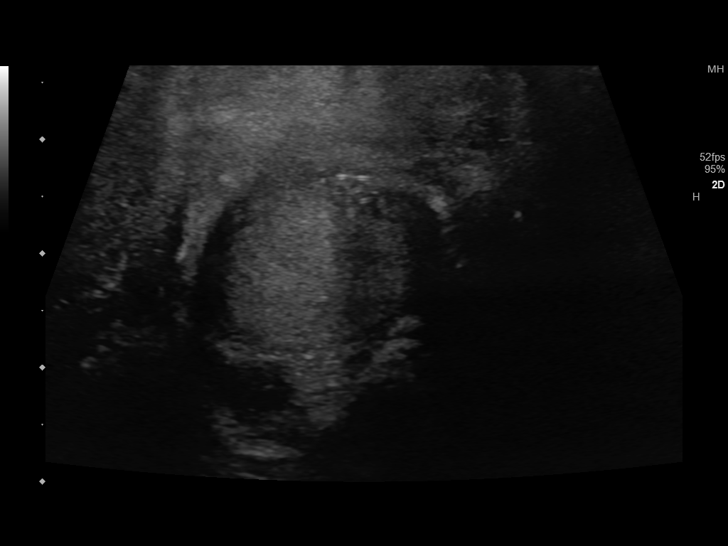
[im 73/80]
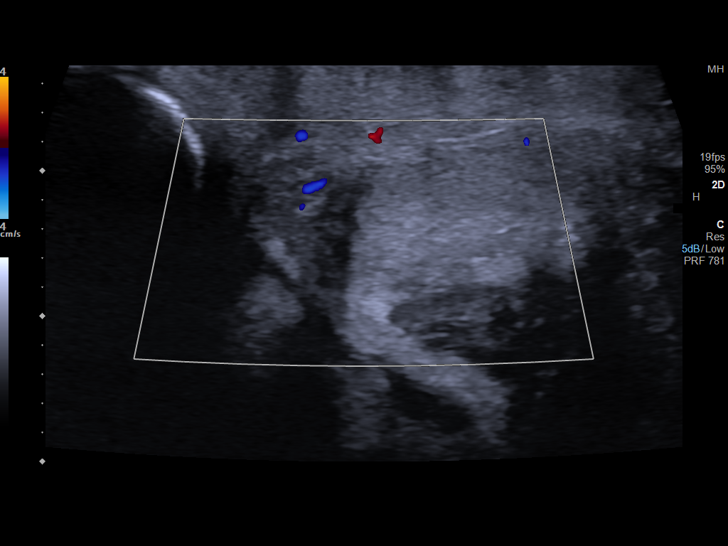
[im 80/80]
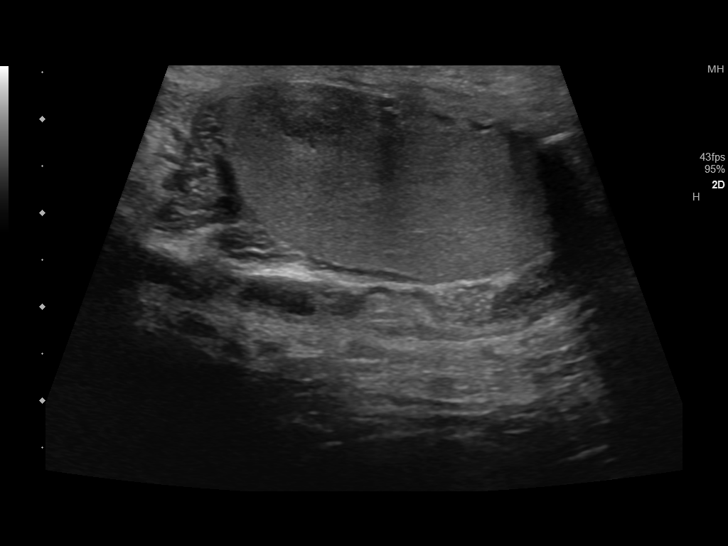

[13 of 25 positions shown; findings below may reference images not displayed]

FINDINGS: Right testicle

Measurements: 2.5 x 2.7 x 4.6 cm. Two adjacent rounded complex
cystic structures measuring 9 mm and 7 mm respectively. These were
previously more echogenic and located more along the nondependent
portion of the testicle as findings likely represent
resolving/evolving parenchymal hematomas.

Left testicle

Measurements: 2.3 x 2.7 x 4.1 cm. Oval 1.1 cm hypoechoic region over
the nondependent portion likely resolving posttraumatic versus
postsurgical changes.

Right epididymis: 5 mm minimally complicated cyst versus
spermatocele over the head of the right epididymis.

Left epididymis:  Normal in size and appearance.

Hydrocele:  Very small bilaterally.

Varicocele:  None.

Pulsed Doppler interrogation of both testes demonstrates normal low
resistance arterial and venous waveforms bilaterally.
IMPRESSION: Hypoechoic areas as described within both testicles likely resolving
posttraumatic contusion/hematoma. Recommend follow-up ultrasound 6-8
weeks. Normal vascularity to both testicles.

5 mm minimally complicated right epididymal cyst versus
spermatocele. Small hydroceles bilaterally.
